# Patient Record
Sex: Male | Born: 1935 | Race: Black or African American | Hispanic: No | Marital: Married | Smoking: Never smoker
Health system: Southern US, Community
[De-identification: ages and names within clinical notes are randomized; demographics above are authoritative.]

## PROBLEM LIST (undated history)

## (undated) DIAGNOSIS — M1990 Unspecified osteoarthritis, unspecified site: Secondary | ICD-10-CM

## (undated) DIAGNOSIS — I10 Essential (primary) hypertension: Secondary | ICD-10-CM

## (undated) HISTORY — PX: HERNIA REPAIR: SHX51

---

## 2005-03-18 ENCOUNTER — Encounter (INDEPENDENT_AMBULATORY_CARE_PROVIDER_SITE_OTHER): Payer: Self-pay | Admitting: Family Medicine

## 2005-06-13 ENCOUNTER — Ambulatory Visit: Payer: Self-pay | Admitting: Family Medicine

## 2005-06-17 ENCOUNTER — Encounter (INDEPENDENT_AMBULATORY_CARE_PROVIDER_SITE_OTHER): Payer: Self-pay | Admitting: Family Medicine

## 2005-06-17 LAB — CONVERTED CEMR LAB
PSA: 3.53 ng/mL
TSH: 1.35 microintl units/mL

## 2005-07-18 ENCOUNTER — Observation Stay (HOSPITAL_COMMUNITY): Admission: EM | Admit: 2005-07-18 | Discharge: 2005-07-20 | Payer: Self-pay | Admitting: Emergency Medicine

## 2005-08-01 ENCOUNTER — Ambulatory Visit: Payer: Self-pay | Admitting: Family Medicine

## 2005-08-08 ENCOUNTER — Ambulatory Visit: Payer: Self-pay | Admitting: Family Medicine

## 2005-09-08 ENCOUNTER — Ambulatory Visit: Payer: Self-pay | Admitting: Family Medicine

## 2005-10-10 ENCOUNTER — Ambulatory Visit: Payer: Self-pay | Admitting: Family Medicine

## 2005-10-31 ENCOUNTER — Ambulatory Visit: Payer: Self-pay | Admitting: Family Medicine

## 2005-11-01 ENCOUNTER — Encounter (INDEPENDENT_AMBULATORY_CARE_PROVIDER_SITE_OTHER): Payer: Self-pay | Admitting: Family Medicine

## 2005-11-01 LAB — CONVERTED CEMR LAB: Blood Glucose, Fasting: 100 mg/dL

## 2005-11-10 ENCOUNTER — Ambulatory Visit (HOSPITAL_COMMUNITY): Admission: RE | Admit: 2005-11-10 | Discharge: 2005-11-10 | Payer: Self-pay | Admitting: General Surgery

## 2005-11-10 ENCOUNTER — Encounter (INDEPENDENT_AMBULATORY_CARE_PROVIDER_SITE_OTHER): Payer: Self-pay | Admitting: *Deleted

## 2005-11-22 ENCOUNTER — Ambulatory Visit: Payer: Self-pay | Admitting: Family Medicine

## 2006-01-05 ENCOUNTER — Ambulatory Visit: Payer: Self-pay | Admitting: Family Medicine

## 2006-01-19 ENCOUNTER — Ambulatory Visit: Payer: Self-pay | Admitting: Family Medicine

## 2006-02-23 ENCOUNTER — Ambulatory Visit: Payer: Self-pay | Admitting: Family Medicine

## 2006-03-28 ENCOUNTER — Ambulatory Visit: Payer: Self-pay | Admitting: Family Medicine

## 2006-04-25 ENCOUNTER — Ambulatory Visit: Payer: Self-pay | Admitting: Family Medicine

## 2006-05-23 ENCOUNTER — Ambulatory Visit: Payer: Self-pay | Admitting: Family Medicine

## 2006-06-19 ENCOUNTER — Ambulatory Visit: Payer: Self-pay | Admitting: Family Medicine

## 2006-06-20 ENCOUNTER — Encounter (INDEPENDENT_AMBULATORY_CARE_PROVIDER_SITE_OTHER): Payer: Self-pay | Admitting: Family Medicine

## 2006-06-20 LAB — CONVERTED CEMR LAB
PSA: 2.96 ng/mL
RBC count: 4.56 10*6/uL
WBC, blood: 6.6 10*3/uL

## 2006-07-24 ENCOUNTER — Encounter: Payer: Self-pay | Admitting: Family Medicine

## 2006-07-24 DIAGNOSIS — E785 Hyperlipidemia, unspecified: Secondary | ICD-10-CM

## 2006-07-24 DIAGNOSIS — I517 Cardiomegaly: Secondary | ICD-10-CM | POA: Insufficient documentation

## 2006-07-24 DIAGNOSIS — I872 Venous insufficiency (chronic) (peripheral): Secondary | ICD-10-CM | POA: Insufficient documentation

## 2006-07-24 DIAGNOSIS — Z860100 Personal history of colon polyps, unspecified: Secondary | ICD-10-CM | POA: Insufficient documentation

## 2006-07-24 DIAGNOSIS — I7300 Raynaud's syndrome without gangrene: Secondary | ICD-10-CM

## 2006-07-24 DIAGNOSIS — I10 Essential (primary) hypertension: Secondary | ICD-10-CM | POA: Insufficient documentation

## 2006-11-14 ENCOUNTER — Ambulatory Visit: Payer: Self-pay | Admitting: Family Medicine

## 2006-11-14 DIAGNOSIS — N401 Enlarged prostate with lower urinary tract symptoms: Secondary | ICD-10-CM

## 2006-11-14 LAB — CONVERTED CEMR LAB
Cholesterol, target level: 200 mg/dL
HDL goal, serum: 40 mg/dL
LDL Goal: 100 mg/dL

## 2006-12-27 ENCOUNTER — Ambulatory Visit: Payer: Self-pay | Admitting: Family Medicine

## 2006-12-27 DIAGNOSIS — M1990 Unspecified osteoarthritis, unspecified site: Secondary | ICD-10-CM | POA: Insufficient documentation

## 2006-12-28 ENCOUNTER — Encounter (INDEPENDENT_AMBULATORY_CARE_PROVIDER_SITE_OTHER): Payer: Self-pay | Admitting: Family Medicine

## 2006-12-29 ENCOUNTER — Encounter (INDEPENDENT_AMBULATORY_CARE_PROVIDER_SITE_OTHER): Payer: Self-pay | Admitting: Family Medicine

## 2006-12-29 LAB — CONVERTED CEMR LAB
ALT: 28 units/L (ref 0–53)
AST: 33 units/L (ref 0–37)
Albumin: 3.8 g/dL (ref 3.5–5.2)
Alkaline Phosphatase: 91 units/L (ref 39–117)
BUN: 18 mg/dL (ref 6–23)
CO2: 28 meq/L (ref 19–32)
Calcium: 9.2 mg/dL (ref 8.4–10.5)
Chloride: 106 meq/L (ref 96–112)
Cholesterol: 203 mg/dL — ABNORMAL HIGH (ref 0–200)
Creatinine, Ser: 1.15 mg/dL (ref 0.40–1.50)
Glucose, Bld: 96 mg/dL (ref 70–99)
HDL: 68 mg/dL (ref >39)
LDL Cholesterol: 120 mg/dL — ABNORMAL HIGH (ref 0–99)
Potassium: 4.7 meq/L (ref 3.5–5.3)
Sodium: 142 meq/L (ref 135–145)
Total Bilirubin: 0.6 mg/dL (ref 0.3–1.2)
Total CHOL/HDL Ratio: 3
Total Protein: 6.6 g/dL (ref 6.0–8.3)
Triglycerides: 74 mg/dL (ref <150)
VLDL: 15 mg/dL (ref 0–40)

## 2007-01-04 ENCOUNTER — Ambulatory Visit: Payer: Self-pay | Admitting: Family Medicine

## 2007-01-04 DIAGNOSIS — J301 Allergic rhinitis due to pollen: Secondary | ICD-10-CM

## 2007-03-21 ENCOUNTER — Telehealth (INDEPENDENT_AMBULATORY_CARE_PROVIDER_SITE_OTHER): Payer: Self-pay | Admitting: *Deleted

## 2007-03-22 ENCOUNTER — Telehealth (INDEPENDENT_AMBULATORY_CARE_PROVIDER_SITE_OTHER): Payer: Self-pay | Admitting: *Deleted

## 2007-03-22 ENCOUNTER — Ambulatory Visit: Payer: Self-pay | Admitting: Family Medicine

## 2007-03-22 DIAGNOSIS — K5900 Constipation, unspecified: Secondary | ICD-10-CM | POA: Insufficient documentation

## 2007-03-22 LAB — CONVERTED CEMR LAB: LDL Goal: 160 mg/dL

## 2007-03-23 ENCOUNTER — Encounter (INDEPENDENT_AMBULATORY_CARE_PROVIDER_SITE_OTHER): Payer: Self-pay | Admitting: Family Medicine

## 2007-03-23 ENCOUNTER — Telehealth (INDEPENDENT_AMBULATORY_CARE_PROVIDER_SITE_OTHER): Payer: Self-pay | Admitting: Family Medicine

## 2007-03-23 LAB — CONVERTED CEMR LAB: Pro B Natriuretic peptide (BNP): 80 pg/mL (ref 0.0–100.0)

## 2007-03-30 ENCOUNTER — Encounter (INDEPENDENT_AMBULATORY_CARE_PROVIDER_SITE_OTHER): Payer: Self-pay | Admitting: Family Medicine

## 2007-04-11 ENCOUNTER — Encounter (INDEPENDENT_AMBULATORY_CARE_PROVIDER_SITE_OTHER): Payer: Self-pay | Admitting: Family Medicine

## 2007-04-12 ENCOUNTER — Encounter (INDEPENDENT_AMBULATORY_CARE_PROVIDER_SITE_OTHER): Payer: Self-pay | Admitting: Family Medicine

## 2007-04-22 ENCOUNTER — Emergency Department (HOSPITAL_COMMUNITY): Admission: EM | Admit: 2007-04-22 | Discharge: 2007-04-22 | Payer: Self-pay | Admitting: Emergency Medicine

## 2007-04-23 ENCOUNTER — Ambulatory Visit: Payer: Self-pay | Admitting: Family Medicine

## 2007-04-23 ENCOUNTER — Telehealth (INDEPENDENT_AMBULATORY_CARE_PROVIDER_SITE_OTHER): Payer: Self-pay | Admitting: *Deleted

## 2007-04-24 ENCOUNTER — Encounter (INDEPENDENT_AMBULATORY_CARE_PROVIDER_SITE_OTHER): Payer: Self-pay | Admitting: Family Medicine

## 2007-04-25 ENCOUNTER — Telehealth (INDEPENDENT_AMBULATORY_CARE_PROVIDER_SITE_OTHER): Payer: Self-pay | Admitting: *Deleted

## 2007-04-25 LAB — CONVERTED CEMR LAB: TSH: 0.927 microintl units/mL (ref 0.350–5.50)

## 2007-05-02 ENCOUNTER — Ambulatory Visit (HOSPITAL_COMMUNITY): Admission: RE | Admit: 2007-05-02 | Discharge: 2007-05-02 | Payer: Self-pay | Admitting: Family Medicine

## 2007-05-02 ENCOUNTER — Encounter (INDEPENDENT_AMBULATORY_CARE_PROVIDER_SITE_OTHER): Payer: Self-pay | Admitting: Family Medicine

## 2007-05-04 ENCOUNTER — Ambulatory Visit: Payer: Self-pay | Admitting: Internal Medicine

## 2007-05-08 ENCOUNTER — Telehealth (INDEPENDENT_AMBULATORY_CARE_PROVIDER_SITE_OTHER): Payer: Self-pay | Admitting: *Deleted

## 2007-05-11 ENCOUNTER — Encounter (INDEPENDENT_AMBULATORY_CARE_PROVIDER_SITE_OTHER): Payer: Self-pay | Admitting: Urology

## 2007-05-11 ENCOUNTER — Inpatient Hospital Stay (HOSPITAL_COMMUNITY): Admission: RE | Admit: 2007-05-11 | Discharge: 2007-05-13 | Payer: Self-pay | Admitting: Urology

## 2007-06-27 ENCOUNTER — Encounter (INDEPENDENT_AMBULATORY_CARE_PROVIDER_SITE_OTHER): Payer: Self-pay | Admitting: Family Medicine

## 2007-08-31 ENCOUNTER — Ambulatory Visit: Payer: Self-pay | Admitting: Family Medicine

## 2007-08-31 DIAGNOSIS — F528 Other sexual dysfunction not due to a substance or known physiological condition: Secondary | ICD-10-CM

## 2007-09-01 ENCOUNTER — Encounter (INDEPENDENT_AMBULATORY_CARE_PROVIDER_SITE_OTHER): Payer: Self-pay | Admitting: Family Medicine

## 2007-09-04 ENCOUNTER — Telehealth (INDEPENDENT_AMBULATORY_CARE_PROVIDER_SITE_OTHER): Payer: Self-pay | Admitting: *Deleted

## 2007-09-04 LAB — CONVERTED CEMR LAB
ALT: 23 units/L (ref 0–53)
AST: 24 units/L (ref 0–37)
Albumin: 4 g/dL (ref 3.5–5.2)
Alkaline Phosphatase: 84 units/L (ref 39–117)
BUN: 20 mg/dL (ref 6–23)
Basophils Absolute: 0 10*3/uL (ref 0.0–0.1)
Basophils Relative: 1 % (ref 0–1)
CO2: 26 meq/L (ref 19–32)
Calcium: 9.2 mg/dL (ref 8.4–10.5)
Chloride: 106 meq/L (ref 96–112)
Cholesterol: 207 mg/dL — ABNORMAL HIGH (ref 0–200)
Creatinine, Ser: 1.15 mg/dL (ref 0.40–1.50)
Eosinophils Absolute: 0.3 10*3/uL (ref 0.0–0.7)
Eosinophils Relative: 6 % — ABNORMAL HIGH (ref 0–5)
Glucose, Bld: 95 mg/dL (ref 70–99)
HCT: 41.8 % (ref 39.0–52.0)
HDL: 66 mg/dL (ref >39)
Hemoglobin: 13.9 g/dL (ref 13.0–17.0)
LDL Cholesterol: 126 mg/dL — ABNORMAL HIGH (ref 0–99)
Lymphocytes Relative: 42 % (ref 12–46)
Lymphs Abs: 1.7 10*3/uL (ref 0.7–4.0)
MCHC: 33.3 g/dL (ref 30.0–36.0)
MCV: 90.1 fL (ref 78.0–100.0)
Monocytes Absolute: 0.3 10*3/uL (ref 0.1–1.0)
Monocytes Relative: 7 % (ref 3–12)
Neutro Abs: 1.9 10*3/uL (ref 1.7–7.7)
Neutrophils Relative %: 45 % (ref 43–77)
Platelets: 209 10*3/uL (ref 150–400)
Potassium: 4.4 meq/L (ref 3.5–5.3)
RBC: 4.64 M/uL (ref 4.22–5.81)
RDW: 12.9 % (ref 11.5–15.5)
Sodium: 141 meq/L (ref 135–145)
TSH: 1.43 microintl units/mL (ref 0.350–5.50)
Total Bilirubin: 0.6 mg/dL (ref 0.3–1.2)
Total CHOL/HDL Ratio: 3.1
Total Protein: 6.8 g/dL (ref 6.0–8.3)
Triglycerides: 74 mg/dL (ref <150)
VLDL: 15 mg/dL (ref 0–40)
WBC: 4.2 10*3/uL (ref 4.0–10.5)

## 2007-10-12 ENCOUNTER — Ambulatory Visit: Payer: Self-pay | Admitting: Family Medicine

## 2007-10-15 ENCOUNTER — Telehealth (INDEPENDENT_AMBULATORY_CARE_PROVIDER_SITE_OTHER): Payer: Self-pay | Admitting: *Deleted

## 2007-12-04 ENCOUNTER — Ambulatory Visit: Payer: Self-pay | Admitting: Family Medicine

## 2007-12-05 ENCOUNTER — Telehealth (INDEPENDENT_AMBULATORY_CARE_PROVIDER_SITE_OTHER): Payer: Self-pay | Admitting: *Deleted

## 2007-12-26 ENCOUNTER — Ambulatory Visit: Payer: Self-pay | Admitting: Family Medicine

## 2007-12-26 DIAGNOSIS — J309 Allergic rhinitis, unspecified: Secondary | ICD-10-CM | POA: Insufficient documentation

## 2008-01-07 ENCOUNTER — Ambulatory Visit: Payer: Self-pay | Admitting: Family Medicine

## 2008-02-18 ENCOUNTER — Encounter (INDEPENDENT_AMBULATORY_CARE_PROVIDER_SITE_OTHER): Payer: Self-pay | Admitting: Family Medicine

## 2008-03-13 ENCOUNTER — Encounter (INDEPENDENT_AMBULATORY_CARE_PROVIDER_SITE_OTHER): Payer: Self-pay | Admitting: Family Medicine

## 2008-03-18 ENCOUNTER — Ambulatory Visit: Payer: Self-pay | Admitting: Family Medicine

## 2008-03-19 ENCOUNTER — Encounter (INDEPENDENT_AMBULATORY_CARE_PROVIDER_SITE_OTHER): Payer: Self-pay | Admitting: Family Medicine

## 2008-03-19 LAB — CONVERTED CEMR LAB
ALT: 31 units/L (ref 0–53)
AST: 28 units/L (ref 0–37)
Albumin: 4 g/dL (ref 3.5–5.2)
Alkaline Phosphatase: 85 units/L (ref 39–117)
BUN: 19 mg/dL (ref 6–23)
CO2: 21 meq/L (ref 19–32)
Calcium: 9 mg/dL (ref 8.4–10.5)
Chloride: 104 meq/L (ref 96–112)
Creatinine, Ser: 1.1 mg/dL (ref 0.40–1.50)
Glucose, Bld: 102 mg/dL — ABNORMAL HIGH (ref 70–99)
Potassium: 4.1 meq/L (ref 3.5–5.3)
Sodium: 138 meq/L (ref 135–145)
Total Bilirubin: 0.5 mg/dL (ref 0.3–1.2)
Total Protein: 7 g/dL (ref 6.0–8.3)

## 2008-03-27 ENCOUNTER — Ambulatory Visit: Payer: Self-pay | Admitting: Family Medicine

## 2008-03-27 DIAGNOSIS — IMO0002 Reserved for concepts with insufficient information to code with codable children: Secondary | ICD-10-CM | POA: Insufficient documentation

## 2008-03-27 DIAGNOSIS — R413 Other amnesia: Secondary | ICD-10-CM

## 2008-05-08 ENCOUNTER — Inpatient Hospital Stay (HOSPITAL_COMMUNITY): Admission: EM | Admit: 2008-05-08 | Discharge: 2008-05-09 | Payer: Self-pay | Admitting: Emergency Medicine

## 2008-05-09 ENCOUNTER — Ambulatory Visit: Payer: Self-pay | Admitting: Internal Medicine

## 2008-05-19 ENCOUNTER — Encounter (INDEPENDENT_AMBULATORY_CARE_PROVIDER_SITE_OTHER): Payer: Self-pay | Admitting: Family Medicine

## 2008-05-19 ENCOUNTER — Ambulatory Visit (HOSPITAL_COMMUNITY): Admission: RE | Admit: 2008-05-19 | Discharge: 2008-05-19 | Payer: Self-pay | Admitting: Internal Medicine

## 2008-05-19 ENCOUNTER — Ambulatory Visit: Payer: Self-pay | Admitting: Internal Medicine

## 2008-06-18 ENCOUNTER — Ambulatory Visit: Payer: Self-pay | Admitting: Family Medicine

## 2008-06-18 DIAGNOSIS — A630 Anogenital (venereal) warts: Secondary | ICD-10-CM | POA: Insufficient documentation

## 2008-06-19 LAB — CONVERTED CEMR LAB
ALT: 26 units/L (ref 0–53)
AST: 25 units/L (ref 0–37)
Albumin: 4.2 g/dL (ref 3.5–5.2)
Alkaline Phosphatase: 95 units/L (ref 39–117)
BUN: 15 mg/dL (ref 6–23)
Basophils Absolute: 0 10*3/uL (ref 0.0–0.1)
Basophils Relative: 1 % (ref 0–1)
CO2: 25 meq/L (ref 19–32)
Calcium: 9.4 mg/dL (ref 8.4–10.5)
Chloride: 104 meq/L (ref 96–112)
Creatinine, Ser: 1.19 mg/dL (ref 0.40–1.50)
Eosinophils Absolute: 0.3 10*3/uL (ref 0.0–0.7)
Eosinophils Relative: 5 % (ref 0–5)
Glucose, Bld: 88 mg/dL (ref 70–99)
HCT: 45 % (ref 39.0–52.0)
Hemoglobin: 14.6 g/dL (ref 13.0–17.0)
Lymphocytes Relative: 32 % (ref 12–46)
Lymphs Abs: 1.7 10*3/uL (ref 0.7–4.0)
MCHC: 32.4 g/dL (ref 30.0–36.0)
MCV: 91.8 fL (ref 78.0–100.0)
Monocytes Absolute: 0.3 10*3/uL (ref 0.1–1.0)
Monocytes Relative: 6 % (ref 3–12)
Neutro Abs: 3.1 10*3/uL (ref 1.7–7.7)
Neutrophils Relative %: 57 % (ref 43–77)
Platelets: 202 10*3/uL (ref 150–400)
Potassium: 4 meq/L (ref 3.5–5.3)
RBC: 4.9 M/uL (ref 4.22–5.81)
RDW: 13.3 % (ref 11.5–15.5)
Sodium: 141 meq/L (ref 135–145)
TSH: 1.265 microintl units/mL (ref 0.350–4.50)
Total Bilirubin: 0.5 mg/dL (ref 0.3–1.2)
Total Protein: 7.1 g/dL (ref 6.0–8.3)
WBC: 5.5 10*3/uL (ref 4.0–10.5)

## 2008-06-20 ENCOUNTER — Ambulatory Visit (HOSPITAL_COMMUNITY): Admission: RE | Admit: 2008-06-20 | Discharge: 2008-06-20 | Payer: Self-pay | Admitting: Family Medicine

## 2008-07-18 ENCOUNTER — Ambulatory Visit: Payer: Self-pay | Admitting: Family Medicine

## 2008-08-15 ENCOUNTER — Ambulatory Visit: Payer: Self-pay | Admitting: Family Medicine

## 2008-10-22 ENCOUNTER — Ambulatory Visit: Payer: Self-pay | Admitting: Family Medicine

## 2008-10-22 LAB — CONVERTED CEMR LAB
Bilirubin Urine: NEGATIVE
Glucose, Urine, Semiquant: NEGATIVE
Ketones, urine, test strip: NEGATIVE
Nitrite: NEGATIVE
Protein, U semiquant: NEGATIVE
Specific Gravity, Urine: 1.015
Urobilinogen, UA: 0.2
WBC Urine, dipstick: NEGATIVE
pH: 6

## 2008-10-24 ENCOUNTER — Encounter (INDEPENDENT_AMBULATORY_CARE_PROVIDER_SITE_OTHER): Payer: Self-pay | Admitting: Family Medicine

## 2008-10-28 LAB — CONVERTED CEMR LAB
ALT: 31 U/L
AST: 29 U/L
Albumin: 4 g/dL
Alkaline Phosphatase: 86 U/L
BUN: 15 mg/dL
CO2: 23 meq/L
Calcium: 9.2 mg/dL
Chloride: 105 meq/L
Cholesterol: 197 mg/dL
Creatinine, Ser: 1.19 mg/dL
Glucose, Bld: 105 mg/dL — ABNORMAL HIGH
HDL: 70 mg/dL
LDL Cholesterol: 114 mg/dL — ABNORMAL HIGH
Potassium: 4.1 meq/L
Sodium: 142 meq/L
Total Bilirubin: 0.7 mg/dL
Total CHOL/HDL Ratio: 2.8
Total Protein: 6.6 g/dL
Triglycerides: 66 mg/dL
VLDL: 13 mg/dL

## 2008-11-19 ENCOUNTER — Ambulatory Visit (HOSPITAL_COMMUNITY): Admission: RE | Admit: 2008-11-19 | Discharge: 2008-11-19 | Payer: Self-pay | Admitting: Family Medicine

## 2008-11-19 ENCOUNTER — Ambulatory Visit: Payer: Self-pay | Admitting: Family Medicine

## 2009-02-02 ENCOUNTER — Ambulatory Visit: Payer: Self-pay | Admitting: Family Medicine

## 2009-03-11 ENCOUNTER — Ambulatory Visit: Payer: Self-pay | Admitting: Family Medicine

## 2009-04-16 ENCOUNTER — Telehealth (INDEPENDENT_AMBULATORY_CARE_PROVIDER_SITE_OTHER): Payer: Self-pay | Admitting: Family Medicine

## 2009-04-17 ENCOUNTER — Encounter (INDEPENDENT_AMBULATORY_CARE_PROVIDER_SITE_OTHER): Payer: Self-pay | Admitting: Family Medicine

## 2009-04-20 ENCOUNTER — Encounter (INDEPENDENT_AMBULATORY_CARE_PROVIDER_SITE_OTHER): Payer: Self-pay | Admitting: Family Medicine

## 2009-04-23 ENCOUNTER — Ambulatory Visit: Payer: Self-pay | Admitting: Family Medicine

## 2009-04-23 ENCOUNTER — Ambulatory Visit (HOSPITAL_COMMUNITY): Admission: RE | Admit: 2009-04-23 | Discharge: 2009-04-23 | Payer: Self-pay | Admitting: Family Medicine

## 2009-04-24 ENCOUNTER — Encounter (INDEPENDENT_AMBULATORY_CARE_PROVIDER_SITE_OTHER): Payer: Self-pay | Admitting: Family Medicine

## 2009-04-24 LAB — CONVERTED CEMR LAB
ALT: 30 units/L (ref 0–53)
AST: 31 units/L (ref 0–37)
Albumin: 3.6 g/dL (ref 3.5–5.2)
Alkaline Phosphatase: 74 units/L (ref 39–117)
BUN: 18 mg/dL (ref 6–23)
Basophils Absolute: 0 10*3/uL (ref 0.0–0.1)
Basophils Relative: 1 % (ref 0–1)
CO2: 23 meq/L (ref 19–32)
Calcium: 8.9 mg/dL (ref 8.4–10.5)
Chloride: 103 meq/L (ref 96–112)
Creatinine, Ser: 1.14 mg/dL (ref 0.40–1.50)
Eosinophils Absolute: 0.2 10*3/uL (ref 0.0–0.7)
Eosinophils Relative: 5 % (ref 0–5)
Glucose, Bld: 70 mg/dL (ref 70–99)
HCT: 36.6 % — ABNORMAL LOW (ref 39.0–52.0)
Hemoglobin: 12.4 g/dL — ABNORMAL LOW (ref 13.0–17.0)
Lymphocytes Relative: 29 % (ref 12–46)
Lymphs Abs: 1.2 10*3/uL (ref 0.7–4.0)
MCHC: 33.9 g/dL (ref 30.0–36.0)
MCV: 91.5 fL (ref 78.0–100.0)
Monocytes Absolute: 0.4 10*3/uL (ref 0.1–1.0)
Monocytes Relative: 9 % (ref 3–12)
Neutro Abs: 2.4 10*3/uL (ref 1.7–7.7)
Neutrophils Relative %: 57 % (ref 43–77)
Platelets: 175 10*3/uL (ref 150–400)
Potassium: 3.9 meq/L (ref 3.5–5.3)
RBC: 4 M/uL — ABNORMAL LOW (ref 4.22–5.81)
RDW: 13.4 % (ref 11.5–15.5)
Sodium: 138 meq/L (ref 135–145)
Total Bilirubin: 0.5 mg/dL (ref 0.3–1.2)
Total Protein: 6.1 g/dL (ref 6.0–8.3)
WBC: 4.3 10*3/uL (ref 4.0–10.5)

## 2009-04-27 LAB — CONVERTED CEMR LAB
Ferritin: 203 ng/mL (ref 22–322)
Folate: >20 ng/mL
Vitamin B-12: 832 pg/mL (ref 211–911)

## 2009-04-29 ENCOUNTER — Encounter (INDEPENDENT_AMBULATORY_CARE_PROVIDER_SITE_OTHER): Payer: Self-pay | Admitting: Family Medicine

## 2009-04-30 ENCOUNTER — Ambulatory Visit (HOSPITAL_COMMUNITY): Admission: RE | Admit: 2009-04-30 | Discharge: 2009-04-30 | Payer: Self-pay | Admitting: Family Medicine

## 2009-05-01 ENCOUNTER — Encounter (INDEPENDENT_AMBULATORY_CARE_PROVIDER_SITE_OTHER): Payer: Self-pay | Admitting: Family Medicine

## 2009-05-14 ENCOUNTER — Encounter (INDEPENDENT_AMBULATORY_CARE_PROVIDER_SITE_OTHER): Payer: Self-pay | Admitting: Family Medicine

## 2010-09-02 ENCOUNTER — Ambulatory Visit (HOSPITAL_COMMUNITY)
Admission: RE | Admit: 2010-09-02 | Discharge: 2010-09-02 | Payer: Self-pay | Source: Home / Self Care | Attending: Family Medicine | Admitting: Family Medicine

## 2010-11-08 ENCOUNTER — Encounter (HOSPITAL_COMMUNITY): Payer: Self-pay | Admitting: Radiology

## 2010-11-08 ENCOUNTER — Emergency Department (HOSPITAL_COMMUNITY)
Admission: EM | Admit: 2010-11-08 | Discharge: 2010-11-09 | Disposition: A | Discharge status: Home / Self Care | Payer: Medicare HMO | Attending: Emergency Medicine | Admitting: Emergency Medicine

## 2010-11-08 ENCOUNTER — Emergency Department (HOSPITAL_COMMUNITY): Payer: Medicare HMO

## 2010-11-08 DIAGNOSIS — Z9889 Other specified postprocedural states: Secondary | ICD-10-CM | POA: Insufficient documentation

## 2010-11-08 DIAGNOSIS — R55 Syncope and collapse: Secondary | ICD-10-CM | POA: Insufficient documentation

## 2010-11-08 DIAGNOSIS — I10 Essential (primary) hypertension: Secondary | ICD-10-CM | POA: Insufficient documentation

## 2010-11-08 DIAGNOSIS — M1990 Unspecified osteoarthritis, unspecified site: Secondary | ICD-10-CM | POA: Insufficient documentation

## 2010-11-08 DIAGNOSIS — F0390 Unspecified dementia without behavioral disturbance: Secondary | ICD-10-CM | POA: Insufficient documentation

## 2010-11-08 HISTORY — DX: Essential (primary) hypertension: I10

## 2010-11-08 LAB — POCT I-STAT, CHEM 8
BUN: 22 mg/dL (ref 6–23)
Calcium, Ion: 1.16 mmol/L (ref 1.12–1.32)
Chloride: 106 mEq/L (ref 96–112)
Glucose, Bld: 109 mg/dL — ABNORMAL HIGH (ref 70–99)
Hemoglobin: 12.9 g/dL — ABNORMAL LOW (ref 13.0–17.0)
Potassium: 3.7 mEq/L (ref 3.5–5.1)
TCO2: 25 mmol/L (ref 0–100)

## 2010-11-09 ENCOUNTER — Other Ambulatory Visit (HOSPITAL_COMMUNITY): Payer: Self-pay | Admitting: Family Medicine

## 2010-11-09 DIAGNOSIS — R55 Syncope and collapse: Secondary | ICD-10-CM

## 2010-11-11 ENCOUNTER — Ambulatory Visit (HOSPITAL_COMMUNITY)
Admission: RE | Admit: 2010-11-11 | Discharge: 2010-11-11 | Disposition: A | Discharge status: Home / Self Care | Payer: Medicare HMO | Source: Ambulatory Visit | Attending: Family Medicine | Admitting: Family Medicine

## 2010-11-11 DIAGNOSIS — R55 Syncope and collapse: Secondary | ICD-10-CM | POA: Insufficient documentation

## 2010-11-11 DIAGNOSIS — Z87891 Personal history of nicotine dependence: Secondary | ICD-10-CM | POA: Insufficient documentation

## 2010-11-11 DIAGNOSIS — I10 Essential (primary) hypertension: Secondary | ICD-10-CM | POA: Insufficient documentation

## 2010-11-11 DIAGNOSIS — I517 Cardiomegaly: Secondary | ICD-10-CM

## 2010-12-15 ENCOUNTER — Ambulatory Visit: Payer: Medicare HMO | Attending: Neurology

## 2010-12-15 DIAGNOSIS — G4733 Obstructive sleep apnea (adult) (pediatric): Secondary | ICD-10-CM | POA: Insufficient documentation

## 2010-12-15 DIAGNOSIS — R0989 Other specified symptoms and signs involving the circulatory and respiratory systems: Secondary | ICD-10-CM | POA: Insufficient documentation

## 2010-12-15 DIAGNOSIS — R0609 Other forms of dyspnea: Secondary | ICD-10-CM | POA: Insufficient documentation

## 2010-12-19 NOTE — Procedures (Signed)
NAMEBRICEN, VICTORY                ACCOUNT NO.:  [id number]  MEDICAL RECORD NO.:  [id number]          PATIENT TYPE:  OUT  LOCATION:  SLEEP LAB                     FACILITY:  [facility]  PHYSICIAN:  Jisell Majer A. Gerilyn Pilgrim, M.D. DATE OF BIRTH:  07/21/1924  DATE OF STUDY:  12/15/2010                           NOCTURNAL POLYSOMNOGRAM  REFERRING PHYSICIAN:  REFERRING PHYSICIAN:  Constantinos Krempasky  INDICATIONS:  An 77 year old man who presents with difficulty sleeping, snoring, and hypersomnia.  INDICATION FOR STUDY:  EPWORTH SLEEPINESS SCORE:  MEDICATIONS:  Losartan, aspirin, donepezil.  EPWORTH SLEEPINESS SCALE: 1. BMI 28.  ARCHITECTURAL SUMMARY:  Total recording time is 446 minutes.  Sleep efficiency was very low at 18%.  Sleep latency 55 minutes.  REM latency 195 minutes.  Stage N1 16%, N2 58%, N3 0%, and REM sleep 25.6%.  RESPIRATORY SUMMARY:  Baseline oxygen saturation is 98, lowest saturation 85.  Diagnostic AHI of 8.8 and RDI 9.5.  LIMB MOVEMENT SUMMARY:  PLM index 0.  ELECTROCARDIOGRAM SUMMARY:  Average heart rate is 62 with no significant dysrhythmias observed.  IMPRESSION: 1. Mild obstructive sleep apnea syndrome, not requiring positive     pressure treatment. 2. Abnormal architecture with very poor sleep efficiency.  SLEEP ARCHITECTURE:  RESPIRATORY DATA:  OXYGEN DATA:  CARDIAC DATA:  MOVEMENT-PARASOMNIA:  IMPRESSIONS-RECOMMENDATIONS:     Lihanna Biever A. Gerilyn Pilgrim, M.D. Electronically Signed 12/19/2010 21:23:11    KAD/MEDQ  D:  12/18/2010 21:23:41  T:  12/19/2010 01:23:48  Job:  [id number]

## 2011-01-11 NOTE — Op Note (Signed)
NAMEARGELIO, [name]                ACCOUNT NO.:  [id number]   MEDICAL RECORD NO.:  [id number]          PATIENT TYPE:  AMB   LOCATION:  DAY                           FACILITY:  [facility]   PHYSICIAN:  [name], M.D. DATE OF BIRTH:  1923/12/10   DATE OF PROCEDURE:  05/19/2008  DATE OF DISCHARGE:                               OPERATIVE REPORT   PROCEDURE:  Diagnostic colonoscopy.   INDICATIONS FOR PROCEDURE:  An 77 year old gentleman with chronic  constipation, history of anorectal condyloma acuminatum, recently  treated with cryotherapy by Dr. Suan Halter.  He developed small-volume  hematochezia after taking a hydrogen peroxide enema.  CT suggested  nonspecific proctitis and history of a colonic adenomas previously.  Colonoscopy is now being done.  The risks, benefits, alternatives, and  limitations have been reviewed.  Questions were answered.  He is  agreeable.  Please see documentation in the medical record.   PROCEDURE NOTE:  O2 saturation, blood pressure, pulse, and respirations  were monitored throughout the entirety of the procedure.   CONSCIOUS SEDATION:  Versed 2 mg IV and Demerol 50 mg IV, single dose.   INSTRUMENT:  Pentax video chip system.   FINDINGS:  Digital exam revealed no abnormalities.  Perianal mucosa  appeared to be locally inflamed likely related to a recent cryotherapy,  did not see any residual condyloma.  Endoscopic findings:  The prep was  adequate, which is somewhat surprising when [name] told me he ate a  regular dinner with roast beef last night because he was hungry.  He did  take the prep subsequently.  Colon:  Colonic mucosa was surveyed from  the rectosigmoid junction through the left transverse, right colon, to  the appendiceal orifice, ileocecal valve, and cecum.  These structures  well seen and photographed for the record.  From this level, the scope  was slowly withdrawn.  All previously mentioned mucosal surfaces were  again seen.  The  patient had just a few scattered pancolonic  diverticula.  The remainder of the colonic mucosa appeared normal.  Scope was pulled down the rectum where thorough examination of the  rectal mucosa including retroflex view of the anal verge demonstrated  some submucosal petechiae, friability, circumferential distal 5 cm of  the rectum.  There were no ulcers, no evidence of polyp or neoplasia.  The patient tolerated the procedure well and was reactive in Endoscopy.   IMPRESSION:  Somewhat inflamed-appearing distal rectum as described  above, more than what is typically seen with enema trauma.  This likely  may be the residual from hydrogen peroxide enema and possibly a minor  contribution from recent anorectal cryotherapy.   RECOMMENDATIONS:  1. I would like to get his bowels moving better.  Constipation led to      the peroxide enema which I have strongly admonished [name] not to      use in the future.  He should take MiraLax 17 g orally at bedtime      for      constipation.  2. Canasa 1 g mesalamine suppository one per rectum at  bedtime x2      weeks, #14 prescribed.   I anticipate his rectal bleeding will be a self-limited phenomenon.      [name], M.D.  Electronically Signed     RMR/MEDQ  D:  05/19/2008  T:  05/20/2008  Job:  [id number]   cc:   [name], M.D.   Ree Kida MD Margo Aye

## 2011-01-11 NOTE — H&P (Signed)
[name], Jackson                ACCOUNT NO.:  [id number]   MEDICAL RECORD NO.:  [id number]          PATIENT TYPE:  AMB   LOCATION:  DAY                           FACILITY:  [facility]   PHYSICIAN:  Ky Barban, M.D.DATE OF BIRTH:  1924/02/26   DATE OF ADMISSION:  DATE OF DISCHARGE:  LH                              HISTORY & PHYSICAL   CHIEF COMPLAINT:  Symptoms of prostatism.   An 77 year old gentleman with further symptoms of prostatism.  His flow  rate is 60 mL per second, average flow rate is 4 mL.  Volume was 223 mL  that was consistent with obstruction.  PSA __________ is normal.  Cystoscopy shows enlarged prostate with bladder neck obstruction.  His  voiding is symptomatic.  He has been taking Flomax and Avodart for long  time and started to have these symptoms, so I advised him to undergo TUR  of prostate.  He came with his son.  Procedure, limitations, and  complications were explained.  They understand and want me to go ahead  and proceed with that.  He is coming as an outpatient. The patient will  have the procedure and then will be admitted in the hospital.   PAST MEDICAL HISTORY:  1. History of penile and anal warts.  2. Erectile dysfunction.  3. Degenerative arthritis.  4. Venous insufficiency.  5. History of syncope.  6. Insomnia.  7. Hypertension.  8. Hyperlipidemia.  9. Colon polyps, benign.   MEDICATIONS:  1. Diovan.  2. Aspirin.  3. Lipitor.  4. Amlodipine.  5. Flomax.  6. Avodart.   PERSONAL HISTORY:  Does not smoke or drink.   REVIEW OF SYSTEMS:  Unremarkable.   PHYSICAL EXAMINATION:  GENERAL:  Well-nourished, well-developed male in  no acute distress.  VITAL SIGNS:  Blood pressure is 130/80.  Temperature is normal.  CENTRAL NERVOUS SYSTEM:  No gross neurological deficit.  HEENT:  Negative.  CHEST:  Symmetrical.  HEART:  Regular sinus rhythm.  No murmur.  ABDOMEN:  Soft and flat.  Liver, spleen, and kidneys are not palpable.  No CVA  tenderness.  EXTERNAL GENITALIA:  Circumcised male with adequate meatus, testicular  normal.  RECTAL:  Normal sphincter tone.  Normal rectal mass.  Prostate 2+,  smooth and firm.   IMPRESSION:  1. Benign prostatic hypertrophy with bladder neck obstruction.  2. Hypertension.  3. Erectile dysfunction.   PLAN:  Transurethral resection of prostate and then admit him into the  hospital.      Ky Barban, M.D.  Electronically Signed     MIJ/MEDQ  D:  05/10/2007  T:  05/10/2007  Job:  [id number]   cc:   [name], M.D.

## 2011-01-11 NOTE — Op Note (Signed)
[name], [name]                ACCOUNT NO.:  [id number]   MEDICAL RECORD NO.:  [id number]          PATIENT TYPE:  INP   LOCATION:  A328                          FACILITY:  [facility]   PHYSICIAN:  Dalia Heading, M.D.  DATE OF BIRTH:  06-12-1924   DATE OF PROCEDURE:  DATE OF DISCHARGE:                               OPERATIVE REPORT   PREOPERATIVE DIAGNOSIS:  Left inguinal hernia.   POSTOPERATIVE DIAGNOSIS:  Left inguinal hernia, direct.   PROCEDURE:  Left inguinal herniorrhaphy.   SURGEON:  Dalia Heading, MD   ANESTHESIA:  Spinal.   INDICATIONS:  The patient is an 77 year old black male who presents with  a symptomatic left inguinal hernia.  The risks and benefits of the  procedure including bleeding, infection, pain, and recurrence of hernia,  were fully explained to the patient, who gave informed consent.   PROCEDURE NOTE:  The patient was already in the supine position after  undergoing a TURP by Dr. Jerre Simon of urology.  The left groin was prepped  and draped using the usual sterile technique with Betadine.  Surgical  site confirmation was performed.   A transverse incision was made down to the external oblique aponeurosis.  The aponeurosis was incised to the external ring.  A Penrose drain was  placed around the spermatic cord.  The vas deferens was noted within the  spermatic cord.  The ilioinguinal nerve was identified and retracted  inferiorly from the operative field.  The patient was noted to have a  small lipoma of the cord.  This was excised without difficulty.  A  direct hernia was found.  This was along the medial aspect of the floor  of the inguinal canal.  The hernia was incised along its base and  inverted.  A medium-sized polypropylene mesh plug was then placed into  this region and secured circumferentially to the transversalis fascia  using 2-0 Novofil interrupted sutures.  An onlay polypropylene mesh  patch was then placed along the floor of the inguinal  canal and secured  superiorly to the conjoined tendon and inferiorly to the shelving edge  of Poupart's ligament using 2-0 Novofil interrupted sutures.  The  internal ring was recreated using a 2-0 Novofil interrupted suture.  The  external oblique aponeurosis was reapproximated using a 2-0 Vicryl  running suture.  The subcutaneous layer was reapproximated using a 3-0  Vicryl interrupted suture.  The skin was closed using a 4-0 Vicryl  subcuticular suture.  Sensorcaine 0.5% was instilled in the surrounding  wound.  Dermabond was then applied.   All tape and needle counts were correct at the end of the procedure.  The patient was transferred to PACU in stable condition.   COMPLICATIONS:  None.   SPECIMEN:  None.   BLOOD LOSS:  Minimal.      Dalia Heading, M.D.  Electronically Signed     MAJ/MEDQ  D:  05/11/2007  T:  05/12/2007  Job:  [id number]   cc:   [name], M.D.   Ky Barban, M.D.  Fax: [phone]

## 2011-01-11 NOTE — Consult Note (Signed)
NAMESHAMARION, [name]                ACCOUNT NO.:  [id number]   MEDICAL RECORD NO.:  [id number]          PATIENT TYPE:  INP   LOCATION:  A306                          FACILITY:  [facility]   PHYSICIAN:  [name], M.D. DATE OF BIRTH:  09/24/1923   DATE OF CONSULTATION:  05/09/2008  DATE OF DISCHARGE:                                 CONSULTATION   REASON FOR CONSULTATION:  GI bleed.   REQUESTING PHYSICIAN:  [name], M.D., with [facility].   HISTORY OF PRESENT ILLNESS:  Patient is an 77 year old African American  gentleman who presented to the emergency department with complaint of 2-  day history of rectal bleeding.  He states that he has a history of  perianal warts.  He had one frozen about 4 weeks ago by Dr. [name].  Over the last week, he has just felt more uncomfortable with some  constipation.  He does have chronic constipation, generally having a  bowel movement about 3 times a week.  He had went 2-3 days without a  bowel movement.  He was straining quite a bit and had the feeling that  he had to have a BM.  He had some itching as well and decided to take an  enema with peroxide at home.  Then he started having rectal pain and had  some small volume hematochezia after this.  He denies any abdominal  pain, no nausea or vomiting.  Appetite has been good.   HOME MEDICATIONS:  Blood pressure pill, cholesterol pill, Aleve 1 daily  for chronic back pain.   ALLERGIES:  No known drug allergies.   PAST MEDICAL HISTORY:  1. Hypertension.  2. BPH.  3. He had a TURP and left inguinal hernia repair in September 2008.  4. Hyperlipidemia.  5. History of anal/rectal warts.  6. Colonoscopy in March 2007, by Dr. [name] revealed a sessile      polyp in the rectum which was adenoma, a rectal mass which was      biopsied and turned out to be a condyloma acuminatum, a few      scattered diverticulum as well.   FAMILY HISTORY:  Negative for colorectal cancer or  chronic GI illnesses.   SOCIAL HISTORY:  He is married.  He has 2 daughters and a son who are  present today.  He quit smoking over 20 years ago.  No alcohol use.   REVIEW OF SYSTEMS:  See HPI for GI.  CONSTITUTIONAL:  No weight loss.  CARDIOPULMONARY:  No chest pain or shortness of breath.  GENITOURINARY:  No dysuria or hematuria.   PHYSICAL EXAMINATION:  VITAL SIGNS:  Temperature 98.6, pulse 86,  respirations 20, blood pressure 134/72, height 70 inches, weight 92.3  kg.  GENERAL APPEARANCE:  A pleasant, elderly black gentleman in no acute  distress.  SKIN:  Warm and dry, no jaundice.  HEENT:  Sclerae are nonicteric.  Oropharyngeal mucosa moist and pink.  No lesions, erythema or exudate.  NECK:  No lymphadenopathy or thyromegaly.  CHEST:  Lungs are clear to auscultation.  CARDIAC:  Regular  rate and rhythm, normal S1 and S2, no murmurs, rubs,  or gallops.  ABDOMEN:  Positive bowel sounds.  Abdomen soft, nontender, nondistended,  no organomegaly or masses.  No rebound, no guarding, no abdominal bruits  or hernias.  RECTAL:  One area of scarring at about 3 o'clock.  No masses externally.  There is some blood perianally and a small amount of gross blood on the  examination glove after internal exam.  Digital rectal exam was  nontender.   LABORATORY DATA:  Sodium 140, potassium 4.1, BUN 9, creatinine 1.18.  White count 8000, hemoglobin 13.9 initially, 13.6 today; platelets  182,000.  LFTs normal.  Lipase 25.  INR 1.   A CT of the abdomen and pelvis revealed stranding in the pararectal fat,  questionable nonspecific proctitis.   IMPRESSION:  Patient is an 77 year old African American gentleman with  chronic constipation, history of anorectal condyloma acuminatum, who  developed small volume hematochezia after taking a peroxide enema.  He  has had a positive tennismus.  CT shows nonspecific proctitis which may  be due to a peroxide enema.  There is no significant stool load on CT.   Given his history of prior adenomatous rectal polyp and abnormal CT  would offer an outpatient colonoscopy in the very near future.   PLAN:  1. Outpatient colonoscopy.  2. Given no further bleeding noted and stable hemoglobin, from a GI      standpoint, patient is stable for discharge.      [name], P.AJonathon Jackson, M.D.  Electronically Signed    LL/MEDQ  D:  05/09/2008  T:  05/09/2008  Job:  [id number]   cc:   [name], M.D.

## 2011-01-11 NOTE — Op Note (Signed)
NAMEJAMORION, [name]                ACCOUNT NO.:  [id number]   MEDICAL RECORD NO.:  [id number]          PATIENT TYPE:  INP   LOCATION:  A328                          FACILITY:  [facility]   PHYSICIAN:  Ky Barban, M.D.DATE OF BIRTH:  [birth year]   DATE OF PROCEDURE:  05/11/2007  DATE OF DISCHARGE:  05/02/2007                               OPERATIVE REPORT   PREOPERATIVE DIAGNOSIS:  Benign prostatic hypertrophy and left inguinal  hernia.   POSTOPERATIVE DIAGNOSIS:  Benign prostatic hypertrophy and left inguinal hernia.   PROCEDURE:  Transurethral resection of the prostate.   ANESTHESIA:  Spinal.   DESCRIPTION OF PROCEDURE:  With the patient under spinal anesthesia in  the lithotomy position, usual prep and drape, a #28 Iglesias  resectoscope was introduced and the bladder was inspected.  The  resectoscope was pulled back in the mid prostatic urethra bladder neck  was circumferentially dissected down to the circular fibers.  Bleeders  were coagulated.  The resectoscope was pulled back at the level of the  verumontanum, rotated to 11 o'clock position, resection at that level  was done from the bladder neck to the level of the verumontanum down to  the capsule. Then, the right lobe was resected between 11 and 7 o'clock  position.  Similarly, the left lobe was resected between the 1 and 5  o'clock position. There was a small amount of tissue in the anterior  midline which was resected next.  The posterior midline tissues were  resected at the end very carefully not to injure the sphincter of the  verumontanum.  The prostatic urethra was open, chips were evacuated,  bleeders were coagulated.  At this point, there was no obstruction of  the bladder neck.  The resectoscope was removed.  A 22 three way Foley  catheter left in for drainage, is running clear.  I left the operating  room and Dr. Lovell Sheehan took over the patient, at this point. The patient  is doing fine.  He is going to do the  left inguinal hernia repair.      Ky Barban, M.D.  Electronically Signed     MIJ/MEDQ  D:  05/11/2007  T:  05/12/2007  Job:  [id number]   cc:   Dalia Heading, M.D.  Fax: [phone]

## 2011-01-11 NOTE — H&P (Signed)
NAMESIAOSI, ALTER                ACCOUNT NO.:  [id number]   MEDICAL RECORD NO.:  [id number]          PATIENT TYPE:  AMB   LOCATION:  DAY                           FACILITY:  [facility]   PHYSICIAN:  Dalia Heading, M.D.  DATE OF BIRTH:  Dec 05, 1923   DATE OF ADMISSION:  05/02/2007  DATE OF DISCHARGE:  LH                              HISTORY & PHYSICAL   CHIEF COMPLAINT:  Left inguinal hernia.   HISTORY OF PRESENT ILLNESS:  The patient is an 77 year old black male  who was referred for evaluation and treatment of a left inguinal hernia.  This has been present for many months, but has started causing  discomfort and pain when he is standing or straining.  He would like to  get it fixed while undergoing a TURP by Dr. Jerre Simon of urology.   PAST MEDICAL HISTORY:  Hypertension and BPH.   PAST SURGICAL HISTORY:  Unremarkable.   CURRENT MEDICATIONS:  Blood pressure pill, cholesterol pill, aspirin  which he is holding.   ALLERGIES:  No known drug allergies.   REVIEW OF SYSTEMS:  The patient denies drinking or smoking.  He denies  any other cardiopulmonary difficulties or bleeding disorders.   PHYSICAL EXAMINATION:  GENERAL:  The patient is a well developed, well  nourished black male in no acute distress.  LUNGS:  Clear to auscultation with equal breath sounds bilaterally.  HEART:  Regular rate and rhythm without S3, S4, or murmurs.  ABDOMEN:  Soft, nontender, nondistended.  No hepatosplenomegaly or  masses noted.  A small left inguinal hernia is noted.  No right inguinal  hernia is present.   IMPRESSION:  Left inguinal hernia.   PLAN:  The patient is scheduled for left inguinal herniorrhaphy on  May 11, 2007.  The risks and benefits of the procedure including  bleeding, infection, pain, and recurrence of the hernia were fully  explained to the patient who gave informed consent.      Dalia Heading, M.D.  Electronically Signed     MAJ/MEDQ  D:  05/03/2007  T:  05/03/2007   Job:  [id number]   cc:   [name], M.D.   Ky Barban, M.D.  Fax: [phone]

## 2011-01-11 NOTE — H&P (Signed)
NAMEDETRICK, DANI                ACCOUNT NO.:  [id number]   MEDICAL RECORD NO.:  [id number]          PATIENT TYPE:  INP   LOCATION:  A306                          FACILITY:  [facility]   PHYSICIAN:  [name], M.D.DATE OF BIRTH:  08-13-1924   DATE OF ADMISSION:  05/08/2008  DATE OF DISCHARGE:  LH                              HISTORY & PHYSICAL   ADMISSION DIAGNOSES:  1. Lower gastrointestinal bleed.  2. Probable diverticular bleed versus hemorrhoid.  3. Constipation.   CHIEF COMPLAINT:  Constipation with some bleeding per rectum today.   HISTORY OF PRESENT ILLNESS:  Mr. Loftus is an 77 year old male who  presented to the emergency room with complaints of some abdominal pain,  constipation and less than an ounce of fresh blood in the bathroom.  The  patient usually has regular bowel movement but has not had any bowel  movement in about 3 days.  He noted that his prior bowel movement was  very small and hard in consistency.  He has felt some bloating and  cramping pain.  When the patient went to the bathroom he strained for a  little while and when he wiped himself he noted some bright red blood  and then he had a little amount of bright red blood in the toilet.  The  patient denies any fever or chills.  He has no prior history of  bleeding.  The patient had a colonoscopy about 3 years ago, was told it  was mostly polyp.   He otherwise feels well.  The patient was seen in the emergency room and  an abdominal CT scan was suggestive of possible diverticulosis.  The  patient was hemodynamically stable and hemoglobin and hematocrit were  unremarkable.  Possibility of a rectal mass was also entertained.   REVIEW OF SYSTEMS:  A 10 point review of systems otherwise negative.  The patient denies any weight loss.  No prior history of melenic stools.   PAST MEDICAL HISTORY:  1. Hypertension.  2. Dyslipidemia.  3. History of benign polyp status post colonoscopy about 3 years ago.  4.  History of syncope.  5. History of insomnia.  6. History of chronic renal insufficiency.  7. Degenerative joint disease.  8. Erectile dysfunction.  9. History of penile and anal warts.   MEDICATIONS:  The patient did not bring his home medications but based  on the records it appears that the patient is on the following  medicines:  1. Diovan.  2. Aspirin.  3. Lipitor.  4. Amlodipine.  5. Flomax.  6. Avodart.   ALLERGIES:  No known drug allergies.   FAMILY HISTORY:  Noncontributory.   SOCIAL HISTORY:  The patient lives at home.  He is a lifelong nonsmoker.  Does not drink alcohol.  No illicit drug use.  The patient is very  independent with activities of daily living.  He continues to drive.   PHYSICAL EXAM:  GENERAL:  The patient was conscious, alert, comfortable,  not in acute distress, was well oriented in time, place and person and  was very pleasant.  VITAL SIGNS:  Blood pressure is 141/103 with a pulse of 89, respirations  20, temperature 98.6 degrees Fahrenheit, oxygen saturation is 99% on  room air.  HEENT:  Normocephalic, atraumatic.  Oral mucosa was moist with no  exudates.  NECK:  Supple.  No JVD or lymphadenopathy.  LUNGS:  Were clear clinically with good air entry bilaterally.  HEART:  S1-S2 regular.  No S3, S4, gallops or rubs.  ABDOMEN:  Was soft, nontender.  Bowel sounds positive.  No masses  palpable.  EXTREMITIES:  No pitting pedal edema.  RECTAL:  Exam was performed by the emergency room doctor to show some  bright red blood on the examining glove.  CNS:  Again, grossly intact with no focal neurological deficits.   LABORATORY/DIAGNOSTIC DATA:  White blood cell count 8.2, hemoglobin of  13.9, hematocrit of 41, platelet count was 187 with 85% neutrophils.  PT  was 12.9, INR is 1.1.  Sodium 136, potassium 4.1, chloride of 106, CO2  was 27, glucose 122, BUN of 14, creatinine was 1.21, AST was normal at  28, ALT of 25, albumin 3.6, calcium 9.2.  Urinalysis  was negative.  Lipase was normal at 25.  CT scan shows no significant intraabdominal  masses but there was some perirectal stranding noted with a possible  rectal mass.   ASSESSMENT:  This is an 77 year old male who had a few days of  constipation who strained during a bowel movement and noticed bright red  blood.  The patient was hemodynamically stable and is not significantly  anemic.   PLAN:  1. Will admit the patient to the medical floor.  2. Will monitor for any more evidence of continued bleeding.  3. Will request gastroenterology consult in the morning.  Perhaps this      is a diverticular bleed or just a hemorrhoidal bleed.  However, a      CT scan was suggestive of possible mass in the rectum hence will      request Dr. Jena Gauss, the gastroenterologist, to evaluate.  4. We will place the patient on IV fluids normal saline at 75 mL an      hour.  5. The patient will be maintained on clear liquid diet for now.   CODE STATUS:  The patient is a full code.  I have explained the above  plan to the patient who verbalized full understanding.      [name], M.D.  Electronically Signed     AM/MEDQ  D:  05/09/2008  T:  05/09/2008  Job:  [id number]

## 2011-01-11 NOTE — Discharge Summary (Signed)
[name], [name]                ACCOUNT NO.:  [id number]   MEDICAL RECORD NO.:  [id number]          PATIENT TYPE:  INP   LOCATION:  A306                          FACILITY:  [facility]   PHYSICIAN:  Osvaldo Shipper, MD     DATE OF BIRTH:  [birth year]   DATE OF ADMISSION:  05/08/2008  DATE OF DISCHARGE:  09/11/2009LH                               DISCHARGE SUMMARY   Please review H&P dictated by Dr. Sherle Poe for details regarding the  patient's presenting illness.   DISCHARGE DIAGNOSES:  1. Hematochezia, resolved, likely diverticular versus hemorrhoidal.  2. History of hypertension and dyslipidemia, all stable.   BRIEF HOSPITAL COURSE:  This is an 77 year old African American male who  presented to the hospital with complaints of bleeding per rectum.  Apparently, the patient was constipated, and when he was straining, he  had ounce of fresh blood in the bathroom.  He states he has a bowel  movement about 3-4 times a week and never has any problems usually.  He  was admitted to the hospital for further evaluation and management.  The  patient's hemoglobin at that time of admission was 13.9, a repeat check  this morning was 13.6.  he has not had any more bleeding per rectum.  The rest of his labs are all okay.  He was seen by Gastroenterology, who  felt that he can be followed up as an outpatient, so once the patient  tolerates his supper, he can be discharged home.   Otherwise, he may continue all of his home medications.  Unfortunately,  the patient did not know the names of his medication, so we are not  quite sure what he takes and we do not know the doses of his medications  as well.   DISCHARGE MEDICATIONS:  He has been asked to take over-the-counter  Colace or Senokot everyday to avoid constipation.  Otherwise, he may  continue his home medications, and I was told that he may be on the  following, but this list is not complete, and we do not know the doses,  so he is on Diovan,  aspirin, Lipitor, amlodipine, Flomax, and Avodart.  No doses available.   FOLLOWUP:  Follow up with Dr. Jena Gauss.  He will call the patient's home to  set up an appointment.   DIET:  As instructed by Dr. Jena Gauss.   PHYSICAL ACTIVITY:  No restrictions.   IMAGING:  He did have a CAT scan with contrast done yesterday, which  showed evidence for possible proctitis and maybe some diverticula.  Chest x-ray was also done, which was unremarkable.   Total time at the discharging counter less than 30 minutes.      Osvaldo Shipper, MD  Electronically Signed     GK/MEDQ  D:  05/09/2008  T:  05/10/2008  Job:  [id number]   cc:   [name], M.D.  [address]  [place]  [place] [address]   [name], M.D.

## 2011-01-14 NOTE — Discharge Summary (Signed)
NAMEYOUNG, MULVEY                ACCOUNT NO.:  [id number]   MEDICAL RECORD NO.:  [id number]          PATIENT TYPE:  INP   LOCATION:  A328                          FACILITY:  [facility]   PHYSICIAN:  Ky Barban, M.D.DATE OF BIRTH:  1923/11/26   DATE OF ADMISSION:  05/11/2007  DATE OF DISCHARGE:  09/14/2008LH                               DISCHARGE SUMMARY   HOSPITAL COURSE:  This 77 year old gentleman had symptoms of prostatism  for long time.  Workup showed that he has BPH with bladder neck  obstruction, so we advised him to undergo TUR of prostate.  He has been  on Flomax and Avodart, but continued to have symptoms.  He was taken to  the operating room where TUR of prostate was done.  He has multiple  other problem which includes erectile dysfunction, degenerative  arthritis, history of syncope, insomnia, hypertension, benign polyps  removed from the colon:  The first postop day, he is doing fine.  Urine  is clear.  CVA pain has stopped.  He was out of bed and on a regular  diet.  Second postop day, urine is clear.  Foley catheter was  discontinued.  He is voiding fine.  Pathology report subsequently came  back as BPH.  He is being discharged home and will be followed up by me  in the office.   FINAL DISCHARGE DIAGNOSES.:  1. Benign prostatic hypertrophy.  2. Hypertension.   DISCHARGE MEDICATIONS:  He is advised to continue his usual medications.   FOLLOWUP:  I will see him back in the office in 2 weeks.      Ky Barban, M.D.  Electronically Signed     MIJ/MEDQ  D:  06/06/2007  T:  06/07/2007  Job:  [id number]

## 2011-01-14 NOTE — H&P (Signed)
NAMECHINONSO, LINKER                ACCOUNT NO.:  [id number]   MEDICAL RECORD NO.:  [id number]          PATIENT TYPE:  AMB   LOCATION:  DAY                           FACILITY:  [facility]   PHYSICIAN:  Jerolyn Shin C. Katrinka Blazing, M.D.   DATE OF BIRTH:  01-06-1924   DATE OF ADMISSION:  DATE OF DISCHARGE:  LH                                HISTORY & PHYSICAL   HISTORY OF PRESENT ILLNESS:  This is a 77 year old male who is scheduled for  routine colonoscopy.  He has not had any prior study.  No history of  abdominal pain.  No history of rectal bleeding.  No family history of colon  cancer or polyp.  He will have his routine screening colonoscopy.   PAST HISTORY:  1.  Hypertension.  2.  Possible Raynaud's phenomenon.   MEDICATION:  Diovan HCT 320/12.5.   SLEEP ARCHITECTURE:  No known drug allergies.   PHYSICAL EXAMINATION:  VITAL SIGNS:  Blood pressure 132/72, pulse 83,  respirations 20, weight 200 pounds.  HEENT:  Unremarkable.  NECK:  Supple.  No JVD, bruit, adenopathy or thyromegaly.  CHEST:  Clear to auscultation.  HEART:  Regular rate and rhythm without murmur, gallop, or rub.  ABDOMEN:  Soft, nontender.  No masses.  EXTREMITIES:  No clubbing, cyanosis, or edema.  NEUROLOGIC:  No focal motor, sensory or cerebellar deficit.   IMPRESSION:  1.  Need for screening colonoscopy.  2.  Hypertension.   PLAN:  Screening colonoscopy.      Dirk Dress. Katrinka Blazing, M.D.  Electronically Signed     LCS/MEDQ  D:  11/09/2005  T:  11/09/2005  Job:  [id number]

## 2011-01-14 NOTE — Discharge Summary (Signed)
[name], SEARFOSS                ACCOUNT NO.:  [id number]   MEDICAL RECORD NO.:  [id number]          PATIENT TYPE:  OBV   LOCATION:  A222                          FACILITY:  [facility]   PHYSICIAN:  Osvaldo Shipper, MD     DATE OF BIRTH:  1924-05-13   DATE OF ADMISSION:  07/18/2005  DATE OF DISCHARGE:  11/22/2006LH                                 DISCHARGE SUMMARY   PRIMARY CARE PHYSICIAN:  Greig Castilla B. Early Chars, M.D.   CARDIOLOGIST:  Dani Gobble, M.D.   DISCHARGE DIAGNOSES:  1.  Syncope, unclear etiology, possible vasovagal.  2.  Hypertension, stable.   Please review H&P dictated at the time of admission for details regarding  patient's presenting illness.   BRIEF HOSPITAL COURSE:  Briefly, this is a 77 year old [place] American  male with a recent diagnosis of hypertension who presented to the ED with  syncopal episode.  Patient did not  have any prodromal symptoms prior to the  onset of this syncope.  He was probably unconscious for a few minutes.  Patient  has had an extensive work-up in the hospital including CT scan of  the head which initially showed abnormalities in his left cerebellum for  which patient had a follow-up scan with MRI which did not show any acute  abnormality.  Also showed some white matter changes.   Patient underwent an echocardiogram  as well which showed mild concentric  left ventricular hypertrophy, normal left ventricular size and systolic  function without any regional wall motion abnormalities.  No aortic stenosis  was noted and mild MR and TR was noted.  Patient was also ruled out for  acute coronary syndrome by serial cardiac enzymes.  He was also monitored on  telemetry which did not show any arrhythmias.   Because patient had mentioned a possible past history of a silent MI for  which he was undergoing MI by Dr. Domingo Sep, we consulted Dr. Domingo Sep to  evaluate this patient in the hospital.  Her recommendations were followed.  Patient will likely need  an outpatient stress test which will be arranged by  her.  We will set up patient to follow up with [facility] in  one week.   In the hospital, patient has been asymptomatic, has been ambulating in the  hallways with no difficulties.  He has been chest pain-free.  Denied any  palpitations and denied any further lightheadedness or any other syncopal  episodes.  He is considered stable for discharge.   For further evaluation if patient continues to have these episodes of  lightheadedness, he may need to have either event monitor and/or tilt table  testing.   DISCHARGE MEDICATIONS:  1.  Aspirin 81 mg p.o. daily.  2.  Diovan/HCT 160/12.5 one tablet daily.  3.  Zocor 20 mg p.o. nightly.   FOLLOW UP:  1.  Dani Gobble, M.D., in one week.  2.  [name]. Early Chars, M.D., in two to three weeks.  3.  Carotid Dopplers in six months' time.   DIET:  Heart-healthy diet.   ACTIVITY:  No restrictions.   OTHER INSTRUCTIONS:  Patient has been asked to seek attention if he develops  chest pain, shortness of breath, significant lightheadedness  if he has more  syncopal episodes.   IMAGING AND OTHER STUDIES DURING THIS HOSPITAL STAY:  1.  CAT scan of the head.  2.  MRI and MRA of the head and neck. These have been discussed above.  3.  Chest x-ray did not show any acute abnormality.  4.  Carotid Doppler showed no hemodynamically significant stenosis but did      show bilateral carotid bifurcation and proximal ICA plaque formation and      continued surveillance was recommended.  5.  Echocardiogram also discussed above.   CONSULTATION:  Dani Gobble, M.D.      Osvaldo Shipper, MD  Electronically Signed     GK/MEDQ  D:  07/20/2005  T:  07/20/2005  Job:  [id number]   cc:   [name]. Early Chars, MD  Fax: [phone]   Dani Gobble, MD  Fax: [phone]

## 2011-01-14 NOTE — H&P (Signed)
[name], [name]                ACCOUNT NO.:  [id number]   MEDICAL RECORD NO.:  [id number]          PATIENT TYPE:  OBV   LOCATION:  A222                          FACILITY:  [facility]   PHYSICIAN:  Osvaldo Shipper, MD     DATE OF BIRTH:  12/18/1923   DATE OF ADMISSION:  07/18/2005  DATE OF DISCHARGE:  LH                                HISTORY & PHYSICAL   ADMITTING DIAGNOSES:  1.  Syncopal episode, etiology unclear.  2.  Chest pain, rule out acute coronary syndrome.  3.  Hypertension.   CHIEF COMPLAINT:  Passing out.   HISTORY OF PRESENT ILLNESS:  The patient is a 77 year old African-American  male who is pretty healthy and was only recently diagnosed with hypertension  and was started on Diovan HCT about 3 weeks ago by Dr. Early Chars.  According to  the patient, he was also told that he might have had a silent myocardial  ischemic event some time ago based on his EKG, and was recommended to have a  stress test, which was actually scheduled for tomorrow.   The patient was doing well until this morning when he went for his regular  walk.  The patient walks about 45 minutes to an hour every day.  Today,  after 35 minutes when the patient was walking up hill, he experienced some  left-sided chest discomfort.  It was about 2/10 in intensity.  The patient  got a little bit shortness of breath, decided to curtail his walk, and then  went back to his home, at which point, after resting for a while, the pain  went away.  He is currently chest pain free.  He does not give a history of  any palpitations.  There was no radiation of the pain to any other site.  No  history of any dizziness with that episode.  No history of headaches or  vision problems.   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  He does not remember how long he  was out, but he  thinks he might have been out for a few minutes.  He did  mention urinary incontinence with known stool incontinence.  He did not have  any symptoms treated prior to or after the incident.  No history suggestive  of any seizure episodes.  No history of any chest discomfort.  He did not  have any focal weakness before or after this episode.   The patient gives a history of a syncopal episode about 2 years ago while  sitting in a church.  He did not go to a doctor after that episode.  Otherwise, the patient is pretty healthy and goes for daily walks.   MEDICATIONS AT HOME:  1.  Diovan HCT 160/12.5 started about 3 weeks ago.  2.  Over-the-counter Excedrin as needed.  3.  Tylenol as needed.   ALLERGIES:  No known drug allergies.   PAST  MEDICAL HISTORY:  1.  Hypertension diagnosed recently.  2.  Questionable history of myocardial ischemic event in the past.  3.  No history of any lung disease.  No diabetes.  No cancer.   PAST SURGICAL HISTORY:  He has never had any surgery in the past.   SOCIAL HISTORY:  The patient lives in [place] with his wife.  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  He has a 10-pack year history of smoking and quit  about 20 years ago.  No history of any alcohol intake.  No history of any  illicit drug use.   FAMILY HISTORY:  His father had diabetes and died of coronary artery disease  at the age of 54.  Sister had some unknown cancer.  Otherwise, no other  positive history.   REVIEW OF SYSTEMS:  Ten point review of systems was done which was  unremarkable, except as mentioned in the HPI.   PHYSICAL EXAMINATION:  VITAL SIGNS:  Temperature 97.4, blood pressure  147/80, heart rate 87, respiratory rate 18, saturating 100% on room air.  Orthostatics were done in the ED, and it was negative for any orthostasis.  GENERAL:  A well-developed, well-nourished elderly gentleman in no apparent  distress, very pleasant to talk to.  HEENT:  There is no pattern of icterus.   Oral mucous membranes are moist.  No oral lesions are seen.  NECK:  Soft and supple.  No thyromegaly is appreciated.  CARDIOVASCULAR:  S1 and S2 are normal and regular.  No murmurs appreciated.  PMI is not displaced.  No JVD is seen.  LUNGS:  Clear to auscultation bilaterally.  No wheezing or rhonchi is heard.  ABDOMEN:  Soft, nontender, nondistended.  Bowel sounds are present.  No  organomegaly or masses are appreciated.  EXTREMITIES:  Without edema.  Peripheral pulses are palpable.  NEUROLOGIC:  The patient is alert and oriented.  Cranial nerves II-XII  appears to be normal.  Motor strength 5/5 in all extremities,  most muscle  groups.  Sensory exam is within normal limits.  Cerebellar exam within  normal limits.   LABORATORY DATA:  CBC is completely unremarkable.  PT INR is normal.  CMP  shows glucose of 155, AST 38; otherwise, others are within normal limits.  The urinalysis shows trace protein, a few squamous epithelial cells, 7-10  WBCs; otherwise, unremarkable.  CK total is 397.  MB is elevated slightly at  4.2.  Troponin 0.05.   EKG shows sinus rhythm, normal axis.  Intervals appear to be within normal  limits.  Nonsignificant Q waves in the inferior leads.  There are some  nonspecific T wave changes in I and aVL.  Otherwise, no other ST segment  changes appreciated at this time.  There is probably some early  repolarization.   Chest x-ray has been done, which did not show any acute pathology.   CT scan of the head was done initially which showed questionable vague  multiple low-density areas in the left cerebellum, questionable acute or  subacute ischemic infarction.  MRI was recommended by the radiologist, so  that was also done, which did not show any acute findings.  Chronic  microvascular white matter disease was noted.  MRA of the head and neck were  negative.  IMPRESSION:  This is a 77 year old African-American male who was recently  diagnosed with hypertension and  was undergoing a cardiac evaluation for  possible silent ischemia in the past, who had symptoms of lightheadedness  and possibly symptoms of angina who also had experienced a syncopal episode.  Currently, the patient is completely asymptomatic.  CK and CK-MB are mildly  elevated.  His troponin so far is not concerning.  I think it would be  prudent to admit this gentleman, at least to observation for now, and do  further work-up in the hospital.  Differential diagnoses for a syncope  episode include arrhythmias, acute ischemic event, seizure disorder.  Stroke  has been ruled out by a negative MRI.   PLAN:  1.  Syncope.  Will observe the patient in the hospital to rule him out for      acute coronary syndrome.  Monitor his arrhythmia by telemetry.  For now,      will start him just on aspirin.  I will consult Dr. Domingo Sep in the      morning because of his history of angina, which was followed by a      syncopal episode.  The patient is not really orthostatic in the ED.  2.  Hypertension.  I will hold his Diovan HCT at this time.  Will continue      to home his blood pressure closely and reinstitute medications as      needed.  3.  Echocardiogram will also be done.   Further management decisions will be based on the results of the initial  testing and the patient's response to treatment.      Osvaldo Shipper, MD  Electronically Signed     GK/MEDQ  D:  07/18/2005  T:  07/18/2005  Job:  [id number]   cc:   Tarri Abernethy, M.D.   Dani Gobble, MD  Fax: [phone]

## 2011-01-14 NOTE — H&P (Signed)
[name], [name]                ACCOUNT NO.:  [id number]   MEDICAL RECORD NO.:  [id number]          PATIENT TYPE:  AMB   LOCATION:  DAY                           FACILITY:  [facility]   PHYSICIAN:  Jerolyn Shin C. Katrinka Blazing, M.D.   DATE OF BIRTH:  Oct 16, 1923   DATE OF ADMISSION:  DATE OF DISCHARGE:  LH                                HISTORY & PHYSICAL   No dictation.      Dirk Dress. Katrinka Blazing, M.D.     LCS/MEDQ  D:  11/09/2005  T:  11/09/2005  Job:  [id number]

## 2011-01-14 NOTE — Procedures (Signed)
[name], [name]                ACCOUNT NO.:  [id number]   MEDICAL RECORD NO.:  [id number]          PATIENT TYPE:  OBV   LOCATION:  A222                          FACILITY:  [facility]   PHYSICIAN:  Dani Gobble, MD       DATE OF BIRTH:  08-Dec-1923   DATE OF PROCEDURE:  07/19/2005  DATE OF DISCHARGE:                                  ECHOCARDIOGRAM   INDICATION:  Syncope.   CLINICAL HISTORY:  The technical quality of the study is adequate.   M-MODE TRACINGS:  The aorta measures normally at 3.9 cm.   Left atrium is at the upper limits of normal in size although subjectively  it appears mildly dilated. No obvious clots or masses were appreciated and  the patient appeared to be in sinus rhythm during this procedure.   Intraventricular septum and posterior wall are mildly thickened measured at  1.4 cm and 1.3 cm, respectively.   The aortic valve is trileaflet and pliable with normal leaflet excursion. No  significant aortic insufficiency is noted. Doppler interrogation of the  aortic valve is within normal limits.   The mitral valve also appears structurally normal. It may be just mildly  thickened but without limitation to leaflet excursion. Trivial mitral  regurgitation is noted. No mitral valve prolapse is noted. Doppler  interrogation of the mitral valve is within normal limits.   The pulmonic valve is incompletely visualized but appeared to be grossly  structurally normal.   The tricuspid valve appeared grossly structurally normal with mild tricuspid  regurgitation noted. Estimated pulmonary pressures are at the upper limits  of normal.   The left ventricle is normal in size with the LVIDD measured at 3.4 cm and  the LVISD measured at 2.1 cm. Overall left ventricular systolic function is  normal and no regional wall motion abnormalities are noted. The presence of  diastolic dysfunction is inferred from pulsar Doppler across the mitral  valve.   The right atrium and right  ventricle are borderline dilated but right  ventricular systolic function is normal.   IMPRESSION:  1.  Left atrium borderline dilated.  2.  Mild concentric left ventricular hypertrophy.  3.  Trivial mitral and mild tricuspid regurgitation.  4.  Pulmonary pressures estimated to be at the upper limits of normal.  5.  Normal left ventricular size and systolic function without regional wall      motion abnormality noted.  6.  Borderline dilatation of the right-sided structures but with normal      right ventricular systolic function.  7.  The presence of diastolic dysfunction is inferred from pulsar Doppler      across the mitral valve.           ______________________________  Dani Gobble, MD     AB/MEDQ  D:  07/19/2005  T:  07/19/2005  Job:  [id number]   cc:   Osvaldo Shipper, MD

## 2011-06-01 LAB — DIFFERENTIAL
Eosinophils Relative: 1
Lymphocytes Relative: 17
Lymphocytes Relative: 8 — ABNORMAL LOW
Lymphs Abs: 0.7
Lymphs Abs: 1.3
Monocytes Relative: 6
Neutro Abs: 6.1
Neutrophils Relative %: 76

## 2011-06-01 LAB — URINALYSIS, ROUTINE W REFLEX MICROSCOPIC
Bilirubin Urine: NEGATIVE
Hgb urine dipstick: NEGATIVE
Protein, ur: NEGATIVE
Urobilinogen, UA: 0.2

## 2011-06-01 LAB — COMPREHENSIVE METABOLIC PANEL
AST: 28
Albumin: 3.6
Calcium: 9.2
Creatinine, Ser: 1.21
GFR calc Af Amer: >60
GFR calc non Af Amer: 57 — ABNORMAL LOW
Total Protein: 6.6

## 2011-06-01 LAB — PROTIME-INR
INR: 1
Prothrombin Time: 12.9

## 2011-06-01 LAB — CBC
MCHC: 33.8
MCV: 92
Platelets: 187
RBC: 4.36
RDW: 12.8
WBC: 8

## 2011-06-01 LAB — BASIC METABOLIC PANEL
Calcium: 9.2
Chloride: 105
Creatinine, Ser: 1.18
GFR calc Af Amer: >60
GFR calc non Af Amer: 59 — ABNORMAL LOW

## 2011-06-01 LAB — APTT: aPTT: 24

## 2011-06-01 LAB — LIPASE, BLOOD: Lipase: 25

## 2011-06-10 LAB — BASIC METABOLIC PANEL
BUN: 18
CO2: 27
CO2: 31
Calcium: 8.7
Calcium: 8.9
Chloride: 104
Creatinine, Ser: 1.48
GFR calc Af Amer: 58 — ABNORMAL LOW
GFR calc Af Amer: >60
GFR calc non Af Amer: 46 — ABNORMAL LOW
Glucose, Bld: 121 — ABNORMAL HIGH
Glucose, Bld: 123 — ABNORMAL HIGH
Glucose, Bld: 163 — ABNORMAL HIGH
Potassium: 4.2
Potassium: 4
Sodium: 135
Sodium: 139

## 2011-06-10 LAB — URINALYSIS, ROUTINE W REFLEX MICROSCOPIC
Bilirubin Urine: NEGATIVE
Bilirubin Urine: NEGATIVE
Glucose, UA: NEGATIVE
Glucose, UA: NEGATIVE
Hgb urine dipstick: NEGATIVE
Ketones, ur: NEGATIVE
Nitrite: NEGATIVE
Protein, ur: NEGATIVE
Protein, ur: NEGATIVE
Specific Gravity, Urine: 1.01
Urobilinogen, UA: 0.2
pH: 6

## 2011-06-10 LAB — DIFFERENTIAL
Basophils Absolute: 0
Basophils Absolute: 0
Basophils Relative: 0
Eosinophils Absolute: 0.1
Eosinophils Relative: 2
Eosinophils Relative: 2
Lymphocytes Relative: 17
Lymphs Abs: 0.9
Monocytes Absolute: 0.6
Monocytes Relative: 8
Neutro Abs: 5.3
Neutrophils Relative %: 75

## 2011-06-10 LAB — POCT CARDIAC MARKERS
CKMB, poc: 1.7
Myoglobin, poc: 111
Operator id: 217071
Troponin i, poc: <0.05
Troponin i, poc: <0.05

## 2011-06-10 LAB — CBC
HCT: 38 — ABNORMAL LOW
HCT: 39.3
HCT: 37.2 — ABNORMAL LOW
Hemoglobin: 12.7 — ABNORMAL LOW
Hemoglobin: 13.2
MCHC: 33.5
MCHC: 33.6
MCV: 92.2
Platelets: 161
RBC: 4.12 — ABNORMAL LOW
RBC: 4.28
RDW: 13.2
RDW: 13.2
RDW: 13.7
WBC: 5.4

## 2011-09-26 ENCOUNTER — Emergency Department (HOSPITAL_COMMUNITY)
Admission: EM | Admit: 2011-09-26 | Discharge: 2011-09-26 | Disposition: A | Discharge status: Home / Self Care | Payer: Medicare HMO | Attending: Emergency Medicine | Admitting: Emergency Medicine

## 2011-09-26 ENCOUNTER — Encounter (HOSPITAL_COMMUNITY): Payer: Self-pay

## 2011-09-26 DIAGNOSIS — I10 Essential (primary) hypertension: Secondary | ICD-10-CM | POA: Insufficient documentation

## 2011-09-26 DIAGNOSIS — H9209 Otalgia, unspecified ear: Secondary | ICD-10-CM | POA: Insufficient documentation

## 2011-09-26 DIAGNOSIS — H9190 Unspecified hearing loss, unspecified ear: Secondary | ICD-10-CM | POA: Insufficient documentation

## 2011-09-26 NOTE — ED Notes (Signed)
Ears flushed bilaterally with 1/2 strength peroxide several times. Pt states has some relief. Very small amount of ear wax noted upon removal.  Pt tolerated well.

## 2011-09-26 NOTE — ED Provider Notes (Signed)
Medical screening examination/treatment/procedure(s) were performed by non-physician practitioner and as supervising physician I was immediately available for consultation/collaboration.   Bhavya Grand, MD 09/26/11 1655 

## 2011-09-26 NOTE — ED Notes (Signed)
Pt reports ears have been stopped up for 2 years.  Denies pain.  Pt says came here to have ears irrigated.  Pt says went to Dr. Renard Matter several weeks ago and was told he had a lot of earwax buildup and tried something otc without relief.

## 2011-09-26 NOTE — ED Notes (Signed)
Pt presents secondary to bilateral discomfort in ears. Pt states has seen PCP secondary to hearing loss and "feeling like ears are stopped up". Pt reports using OTC ear wax removal kit purchased from drug store without much relief. Pt continues to be concerned with slight hearing loss.

## 2011-09-26 NOTE — ED Provider Notes (Signed)
History     CSN: [id number]  Arrival date & time 09/26/11  1054   First MD Initiated Contact with Patient 09/26/11 1144      Chief Complaint  Patient presents with  . Otalgia    (Consider location/radiation/quality/duration/timing/severity/associated sxs/prior treatment) HPI Comments: Pt saw his PCP, dr. Renard Matter a couple months ago and was told he had a cerumen impaction.  He purchased an ear cleaning product and has performed it several times.  He still doesn't think he is hearing any better although there is very little wax present.  Patient is a 78 y.o. male presenting with ear pain. The history is provided by the patient. No language interpreter was used.  Otalgia This is a new problem. There is pain in both ears. The problem occurs constantly. The problem has not changed since onset.There has been no fever. The pain is at a severity of 0/10. The patient is experiencing no pain. Associated symptoms include hearing loss. Pertinent negatives include no ear discharge, no rhinorrhea, no sore throat, no neck pain and no cough.    Past Medical History  Diagnosis Date  . Hypertension     Past Surgical History  Procedure Date  . Hernia repair     No family history on file.  History  Substance Use Topics  . Smoking status: Never Smoker   . Smokeless tobacco: Not on file  . Alcohol Use: No      Review of Systems  HENT: Positive for hearing loss and ear pain. Negative for sore throat, rhinorrhea, neck pain and ear discharge.   Respiratory: Negative for cough.   All other systems reviewed and are negative.    Allergies  Review of patient's allergies indicates no known allergies.  Home Medications  No current outpatient prescriptions on file.  BP 163/85  Pulse 79  Temp(Src) 98.3 F (36.8 C) (Oral)  Resp 20  Ht 5\' 10"  (1.778 m)  Wt 192 lb (87.091 kg)  BMI 27.55 kg/m2  SpO2 98%  Physical Exam  Nursing note and vitals reviewed. Constitutional: He is oriented to  person, place, and time. He appears well-developed and well-nourished.  HENT:  Head: Normocephalic and atraumatic.  Right Ear: Tympanic membrane and ear canal normal. There is drainage. Tympanic membrane is not injected, not perforated, not erythematous, not retracted and not bulging. No hemotympanum. Decreased hearing is noted.  Left Ear: Tympanic membrane and ear canal normal. There is drainage. Tympanic membrane is not injected, not perforated, not erythematous, not retracted and not bulging. Tympanic membrane mobility is normal. No hemotympanum. Decreased hearing is noted.  Eyes: EOM are normal.  Neck: Normal range of motion.  Cardiovascular: Normal rate, regular rhythm, normal heart sounds and intact distal pulses.   Pulmonary/Chest: Effort normal and breath sounds normal. No respiratory distress.  Abdominal: Soft. He exhibits no distension. There is no tenderness.  Musculoskeletal: Normal range of motion.  Neurological: He is alert and oriented to person, place, and time.  Skin: Skin is warm and dry.  Psychiatric: He has a normal mood and affect. Judgment normal.    ED Course  Procedures (including critical care time)  Labs Reviewed - No data to display No results found.   No diagnosis found.    MDM        Pt sa  Worthy Rancher, [place] 09/26/11 1343

## 2011-12-15 ENCOUNTER — Other Ambulatory Visit (HOSPITAL_COMMUNITY): Payer: Self-pay | Admitting: Family Medicine

## 2011-12-15 ENCOUNTER — Ambulatory Visit (HOSPITAL_COMMUNITY)
Admission: RE | Admit: 2011-12-15 | Discharge: 2011-12-15 | Disposition: A | Discharge status: Home / Self Care | Payer: Medicare HMO | Source: Ambulatory Visit | Attending: Family Medicine | Admitting: Family Medicine

## 2011-12-15 DIAGNOSIS — M545 Low back pain, unspecified: Secondary | ICD-10-CM | POA: Insufficient documentation

## 2011-12-15 DIAGNOSIS — G8929 Other chronic pain: Secondary | ICD-10-CM

## 2012-01-20 ENCOUNTER — Other Ambulatory Visit (HOSPITAL_COMMUNITY): Payer: Self-pay | Admitting: Family Medicine

## 2012-01-20 DIAGNOSIS — M545 Low back pain: Secondary | ICD-10-CM

## 2012-01-24 ENCOUNTER — Ambulatory Visit (HOSPITAL_COMMUNITY)
Admission: RE | Admit: 2012-01-24 | Discharge: 2012-01-24 | Disposition: A | Discharge status: Home / Self Care | Payer: Medicare HMO | Source: Ambulatory Visit | Attending: Family Medicine | Admitting: Family Medicine

## 2012-01-24 DIAGNOSIS — M545 Low back pain, unspecified: Secondary | ICD-10-CM | POA: Insufficient documentation

## 2012-01-24 DIAGNOSIS — M51379 Other intervertebral disc degeneration, lumbosacral region without mention of lumbar back pain or lower extremity pain: Secondary | ICD-10-CM | POA: Insufficient documentation

## 2012-01-24 DIAGNOSIS — M5137 Other intervertebral disc degeneration, lumbosacral region: Secondary | ICD-10-CM | POA: Insufficient documentation

## 2012-01-24 DIAGNOSIS — M79609 Pain in unspecified limb: Secondary | ICD-10-CM | POA: Insufficient documentation

## 2012-01-24 DIAGNOSIS — M5126 Other intervertebral disc displacement, lumbar region: Secondary | ICD-10-CM | POA: Insufficient documentation

## 2012-01-31 ENCOUNTER — Other Ambulatory Visit: Payer: Self-pay | Admitting: Family Medicine

## 2012-01-31 DIAGNOSIS — M545 Low back pain: Secondary | ICD-10-CM

## 2012-01-31 DIAGNOSIS — IMO0002 Reserved for concepts with insufficient information to code with codable children: Secondary | ICD-10-CM

## 2012-01-31 DIAGNOSIS — M5410 Radiculopathy, site unspecified: Secondary | ICD-10-CM

## 2012-02-22 ENCOUNTER — Ambulatory Visit
Admission: RE | Admit: 2012-02-22 | Discharge: 2012-02-22 | Disposition: A | Discharge status: Home / Self Care | Payer: Medicare HMO | Source: Ambulatory Visit | Attending: Family Medicine | Admitting: Family Medicine

## 2012-02-22 DIAGNOSIS — IMO0002 Reserved for concepts with insufficient information to code with codable children: Secondary | ICD-10-CM

## 2012-02-22 DIAGNOSIS — M5410 Radiculopathy, site unspecified: Secondary | ICD-10-CM

## 2012-02-22 DIAGNOSIS — M545 Low back pain: Secondary | ICD-10-CM

## 2012-02-22 MED ORDER — IOHEXOL 180 MG/ML  SOLN
1.0000 mL | Freq: Once | INTRAMUSCULAR | Status: AC | PRN
  Administered 2012-02-22: 1 mL via EPIDURAL

## 2012-02-22 MED ORDER — METHYLPREDNISOLONE ACETATE 40 MG/ML INJ SUSP (RADIOLOG
120.0000 mg | Freq: Once | INTRAMUSCULAR | Status: AC
  Administered 2012-02-22: 120 mg via EPIDURAL

## 2012-02-22 NOTE — Discharge Instructions (Signed)

## 2012-03-09 ENCOUNTER — Other Ambulatory Visit: Payer: Self-pay | Admitting: Family Medicine

## 2012-03-09 DIAGNOSIS — M79604 Pain in right leg: Secondary | ICD-10-CM

## 2012-03-09 DIAGNOSIS — M545 Low back pain: Secondary | ICD-10-CM

## 2012-03-19 ENCOUNTER — Ambulatory Visit
Admission: RE | Admit: 2012-03-19 | Discharge: 2012-03-19 | Disposition: A | Discharge status: Home / Self Care | Payer: Medicare HMO | Source: Ambulatory Visit | Attending: Family Medicine | Admitting: Family Medicine

## 2012-03-19 DIAGNOSIS — M545 Low back pain: Secondary | ICD-10-CM

## 2012-03-19 DIAGNOSIS — M79605 Pain in left leg: Secondary | ICD-10-CM

## 2012-03-19 MED ORDER — IOHEXOL 180 MG/ML  SOLN
1.0000 mL | Freq: Once | INTRAMUSCULAR | Status: AC | PRN
  Administered 2012-03-19: 1 mL via EPIDURAL

## 2012-03-19 MED ORDER — METHYLPREDNISOLONE ACETATE 40 MG/ML INJ SUSP (RADIOLOG
120.0000 mg | Freq: Once | INTRAMUSCULAR | Status: AC
  Administered 2012-03-19: 120 mg via EPIDURAL

## 2012-09-26 ENCOUNTER — Other Ambulatory Visit (HOSPITAL_COMMUNITY): Payer: Self-pay | Admitting: Family Medicine

## 2012-09-26 ENCOUNTER — Ambulatory Visit (HOSPITAL_COMMUNITY)
Admission: RE | Admit: 2012-09-26 | Discharge: 2012-09-26 | Disposition: A | Discharge status: Home / Self Care | Payer: Medicare HMO | Source: Ambulatory Visit | Attending: Family Medicine | Admitting: Family Medicine

## 2012-09-26 DIAGNOSIS — M169 Osteoarthritis of hip, unspecified: Secondary | ICD-10-CM | POA: Insufficient documentation

## 2012-09-26 DIAGNOSIS — R52 Pain, unspecified: Secondary | ICD-10-CM

## 2012-09-26 DIAGNOSIS — M79609 Pain in unspecified limb: Secondary | ICD-10-CM | POA: Insufficient documentation

## 2012-09-26 DIAGNOSIS — M2340 Loose body in knee, unspecified knee: Secondary | ICD-10-CM | POA: Insufficient documentation

## 2012-09-26 DIAGNOSIS — M25559 Pain in unspecified hip: Secondary | ICD-10-CM | POA: Insufficient documentation

## 2012-09-26 DIAGNOSIS — M1610 Unilateral primary osteoarthritis, unspecified hip: Secondary | ICD-10-CM | POA: Insufficient documentation

## 2013-01-03 ENCOUNTER — Ambulatory Visit (HOSPITAL_COMMUNITY)
Admission: RE | Admit: 2013-01-03 | Discharge: 2013-01-03 | Disposition: A | Discharge status: Home / Self Care | Payer: Medicare HMO | Source: Ambulatory Visit | Attending: Family Medicine | Admitting: Family Medicine

## 2013-01-03 ENCOUNTER — Other Ambulatory Visit (HOSPITAL_COMMUNITY): Payer: Self-pay | Admitting: Family Medicine

## 2013-01-03 DIAGNOSIS — G8929 Other chronic pain: Secondary | ICD-10-CM

## 2013-01-03 DIAGNOSIS — M545 Low back pain, unspecified: Secondary | ICD-10-CM | POA: Insufficient documentation

## 2013-01-03 DIAGNOSIS — M5137 Other intervertebral disc degeneration, lumbosacral region: Secondary | ICD-10-CM | POA: Insufficient documentation

## 2013-01-03 DIAGNOSIS — M51379 Other intervertebral disc degeneration, lumbosacral region without mention of lumbar back pain or lower extremity pain: Secondary | ICD-10-CM | POA: Insufficient documentation

## 2013-01-10 ENCOUNTER — Ambulatory Visit: Payer: Medicare HMO | Admitting: Orthopedic Surgery

## 2013-02-05 ENCOUNTER — Other Ambulatory Visit: Payer: Self-pay | Admitting: Family Medicine

## 2013-02-05 DIAGNOSIS — M549 Dorsalgia, unspecified: Secondary | ICD-10-CM

## 2013-02-20 ENCOUNTER — Other Ambulatory Visit: Payer: Medicare HMO

## 2013-11-22 ENCOUNTER — Other Ambulatory Visit (HOSPITAL_COMMUNITY): Payer: Self-pay | Admitting: Family Medicine

## 2013-11-22 ENCOUNTER — Ambulatory Visit (HOSPITAL_COMMUNITY)
Admission: RE | Admit: 2013-11-22 | Discharge: 2013-11-22 | Disposition: A | Discharge status: Home / Self Care | Payer: Medicare HMO | Source: Ambulatory Visit | Attending: Family Medicine | Admitting: Family Medicine

## 2013-11-22 DIAGNOSIS — M25552 Pain in left hip: Secondary | ICD-10-CM

## 2013-11-22 DIAGNOSIS — M169 Osteoarthritis of hip, unspecified: Secondary | ICD-10-CM | POA: Insufficient documentation

## 2013-11-22 DIAGNOSIS — M25559 Pain in unspecified hip: Secondary | ICD-10-CM | POA: Insufficient documentation

## 2013-11-22 DIAGNOSIS — M1610 Unilateral primary osteoarthritis, unspecified hip: Secondary | ICD-10-CM | POA: Insufficient documentation

## 2013-12-19 ENCOUNTER — Ambulatory Visit: Payer: Medicare HMO | Admitting: Orthopedic Surgery

## 2013-12-24 ENCOUNTER — Ambulatory Visit: Payer: Medicare HMO | Admitting: Orthopedic Surgery

## 2015-06-02 ENCOUNTER — Ambulatory Visit: Payer: Medicare HMO | Admitting: Urology

## 2015-10-21 ENCOUNTER — Emergency Department (HOSPITAL_COMMUNITY)
Admission: EM | Admit: 2015-10-21 | Discharge: 2015-10-21 | Disposition: A | Discharge status: Home / Self Care | Payer: Medicare HMO | Attending: Dermatology | Admitting: Dermatology

## 2015-10-21 ENCOUNTER — Encounter (HOSPITAL_COMMUNITY): Payer: Self-pay | Admitting: Emergency Medicine

## 2015-10-21 DIAGNOSIS — I10 Essential (primary) hypertension: Secondary | ICD-10-CM | POA: Diagnosis not present

## 2015-10-21 DIAGNOSIS — H9203 Otalgia, bilateral: Secondary | ICD-10-CM | POA: Diagnosis not present

## 2015-10-21 NOTE — ED Notes (Signed)
Pt c/o pain to bilateral ears x 2 days. Pt states he has history of having ears irrigated x 2 years ago.

## 2015-10-21 NOTE — ED Notes (Signed)
Per pharmacy tech, patient and family member walked out of department.

## 2016-07-04 ENCOUNTER — Other Ambulatory Visit (HOSPITAL_COMMUNITY): Payer: Self-pay | Admitting: Internal Medicine

## 2016-07-04 ENCOUNTER — Ambulatory Visit (HOSPITAL_COMMUNITY)
Admission: RE | Admit: 2016-07-04 | Discharge: 2016-07-04 | Disposition: A | Discharge status: Home / Self Care | Payer: Medicare HMO | Source: Ambulatory Visit | Attending: Internal Medicine | Admitting: Internal Medicine

## 2016-07-04 DIAGNOSIS — M545 Low back pain: Secondary | ICD-10-CM

## 2016-07-04 DIAGNOSIS — M47896 Other spondylosis, lumbar region: Secondary | ICD-10-CM | POA: Diagnosis not present

## 2016-09-25 ENCOUNTER — Encounter (HOSPITAL_COMMUNITY): Payer: Self-pay

## 2016-09-25 ENCOUNTER — Observation Stay (HOSPITAL_COMMUNITY)
Admission: EM | Admit: 2016-09-25 | Discharge: 2016-09-27 | Disposition: A | Discharge status: Home / Self Care | Payer: Medicare HMO | Attending: Internal Medicine | Admitting: Internal Medicine

## 2016-09-25 ENCOUNTER — Emergency Department (HOSPITAL_COMMUNITY): Payer: Medicare HMO

## 2016-09-25 DIAGNOSIS — M1990 Unspecified osteoarthritis, unspecified site: Secondary | ICD-10-CM | POA: Insufficient documentation

## 2016-09-25 DIAGNOSIS — Z9181 History of falling: Secondary | ICD-10-CM | POA: Diagnosis not present

## 2016-09-25 DIAGNOSIS — R7989 Other specified abnormal findings of blood chemistry: Secondary | ICD-10-CM | POA: Diagnosis not present

## 2016-09-25 DIAGNOSIS — N183 Chronic kidney disease, stage 3 (moderate): Secondary | ICD-10-CM | POA: Diagnosis not present

## 2016-09-25 DIAGNOSIS — I872 Venous insufficiency (chronic) (peripheral): Secondary | ICD-10-CM | POA: Insufficient documentation

## 2016-09-25 DIAGNOSIS — Z66 Do not resuscitate: Secondary | ICD-10-CM | POA: Diagnosis not present

## 2016-09-25 DIAGNOSIS — R55 Syncope and collapse: Secondary | ICD-10-CM | POA: Diagnosis not present

## 2016-09-25 DIAGNOSIS — M5416 Radiculopathy, lumbar region: Secondary | ICD-10-CM | POA: Diagnosis not present

## 2016-09-25 DIAGNOSIS — I7300 Raynaud's syndrome without gangrene: Secondary | ICD-10-CM | POA: Insufficient documentation

## 2016-09-25 DIAGNOSIS — R778 Other specified abnormalities of plasma proteins: Secondary | ICD-10-CM

## 2016-09-25 DIAGNOSIS — I129 Hypertensive chronic kidney disease with stage 1 through stage 4 chronic kidney disease, or unspecified chronic kidney disease: Secondary | ICD-10-CM | POA: Diagnosis not present

## 2016-09-25 DIAGNOSIS — R748 Abnormal levels of other serum enzymes: Secondary | ICD-10-CM | POA: Diagnosis present

## 2016-09-25 DIAGNOSIS — I447 Left bundle-branch block, unspecified: Secondary | ICD-10-CM | POA: Diagnosis present

## 2016-09-25 DIAGNOSIS — I440 Atrioventricular block, first degree: Secondary | ICD-10-CM | POA: Diagnosis not present

## 2016-09-25 DIAGNOSIS — R918 Other nonspecific abnormal finding of lung field: Secondary | ICD-10-CM | POA: Diagnosis not present

## 2016-09-25 DIAGNOSIS — N529 Male erectile dysfunction, unspecified: Secondary | ICD-10-CM | POA: Insufficient documentation

## 2016-09-25 DIAGNOSIS — N39498 Other specified urinary incontinence: Secondary | ICD-10-CM | POA: Diagnosis not present

## 2016-09-25 DIAGNOSIS — J309 Allergic rhinitis, unspecified: Secondary | ICD-10-CM | POA: Insufficient documentation

## 2016-09-25 DIAGNOSIS — I10 Essential (primary) hypertension: Secondary | ICD-10-CM | POA: Diagnosis present

## 2016-09-25 DIAGNOSIS — N401 Enlarged prostate with lower urinary tract symptoms: Secondary | ICD-10-CM | POA: Diagnosis not present

## 2016-09-25 DIAGNOSIS — Z8249 Family history of ischemic heart disease and other diseases of the circulatory system: Secondary | ICD-10-CM | POA: Diagnosis not present

## 2016-09-25 DIAGNOSIS — N138 Other obstructive and reflux uropathy: Secondary | ICD-10-CM | POA: Insufficient documentation

## 2016-09-25 DIAGNOSIS — E785 Hyperlipidemia, unspecified: Secondary | ICD-10-CM | POA: Insufficient documentation

## 2016-09-25 DIAGNOSIS — I517 Cardiomegaly: Secondary | ICD-10-CM | POA: Diagnosis not present

## 2016-09-25 DIAGNOSIS — F0390 Unspecified dementia without behavioral disturbance: Secondary | ICD-10-CM | POA: Insufficient documentation

## 2016-09-25 HISTORY — DX: Unspecified osteoarthritis, unspecified site: M19.90

## 2016-09-25 LAB — COMPREHENSIVE METABOLIC PANEL
ALBUMIN: 3.6 g/dL (ref 3.5–5.0)
ALT: 18 U/L (ref 17–63)
AST: 32 U/L (ref 15–41)
Alkaline Phosphatase: 64 U/L (ref 38–126)
Anion gap: 11 (ref 5–15)
BUN: 18 mg/dL (ref 6–20)
CHLORIDE: 103 mmol/L (ref 101–111)
CO2: 25 mmol/L (ref 22–32)
CREATININE: 1.49 mg/dL — AB (ref 0.61–1.24)
Calcium: 9.5 mg/dL (ref 8.9–10.3)
GFR calc Af Amer: 46 mL/min — ABNORMAL LOW (ref >60)
GFR calc non Af Amer: 40 mL/min — ABNORMAL LOW (ref >60)
GLUCOSE: 103 mg/dL — AB (ref 65–99)
Potassium: 3.8 mmol/L (ref 3.5–5.1)
SODIUM: 139 mmol/L (ref 135–145)
Total Bilirubin: 0.9 mg/dL (ref 0.3–1.2)
Total Protein: 6.5 g/dL (ref 6.5–8.1)

## 2016-09-25 LAB — CBC
HCT: 39.4 % (ref 39.0–52.0)
Hemoglobin: 13.1 g/dL (ref 13.0–17.0)
MCH: 30.9 pg (ref 26.0–34.0)
MCHC: 33.2 g/dL (ref 30.0–36.0)
MCV: 92.9 fL (ref 78.0–100.0)
PLATELETS: 187 10*3/uL (ref 150–400)
RBC: 4.24 MIL/uL (ref 4.22–5.81)
RDW: 12.4 % (ref 11.5–15.5)
WBC: 5.3 10*3/uL (ref 4.0–10.5)

## 2016-09-25 LAB — TROPONIN I: Troponin I: 0.14 ng/mL (ref <0.03)

## 2016-09-25 MED ORDER — ADULT MULTIVITAMIN W/MINERALS CH
1.0000 | ORAL_TABLET | Freq: Every day | ORAL | Status: DC
  Administered 2016-09-26 – 2016-09-27 (×2): 1 via ORAL
  Filled 2016-09-25 (×2): qty 1

## 2016-09-25 MED ORDER — ENOXAPARIN SODIUM 40 MG/0.4ML ~~LOC~~ SOLN
40.0000 mg | SUBCUTANEOUS | Status: DC
  Administered 2016-09-25 – 2016-09-26 (×2): 40 mg via SUBCUTANEOUS
  Filled 2016-09-25 (×2): qty 0.4

## 2016-09-25 MED ORDER — DONEPEZIL HCL 10 MG PO TABS
10.0000 mg | ORAL_TABLET | Freq: Every day | ORAL | Status: DC
  Administered 2016-09-26: 10 mg via ORAL
  Filled 2016-09-25: qty 1

## 2016-09-25 MED ORDER — SODIUM CHLORIDE 0.9% FLUSH
3.0000 mL | Freq: Two times a day (BID) | INTRAVENOUS | Status: DC
  Administered 2016-09-25 – 2016-09-26 (×3): 3 mL via INTRAVENOUS

## 2016-09-25 MED ORDER — ACETAMINOPHEN 500 MG PO TABS: 500.0000 mg | ORAL_TABLET | Freq: Four times a day (QID) | ORAL | Status: DC | PRN

## 2016-09-25 MED ORDER — ASPIRIN 81 MG PO CHEW
324.0000 mg | CHEWABLE_TABLET | Freq: Once | ORAL | Status: AC
  Administered 2016-09-25: 324 mg via ORAL
  Filled 2016-09-25: qty 4

## 2016-09-25 MED ORDER — HYDRALAZINE HCL 20 MG/ML IJ SOLN
10.0000 mg | Freq: Four times a day (QID) | INTRAMUSCULAR | Status: DC | PRN
  Administered 2016-09-25 – 2016-09-27 (×3): 10 mg via INTRAVENOUS
  Filled 2016-09-25 (×3): qty 1

## 2016-09-25 NOTE — ED Notes (Signed)
ED Provider at bedside. 

## 2016-09-25 NOTE — Progress Notes (Signed)
Call received from phlebotomy at [phone] requesting reschedule/confirm [id number] which was scheduled for 8 minutes prior to call.  Phlebotomy informed pt has just arrived on unit, knowledge of intention with orders is not known, would need to page Md. This nurse advised draw the lab, versus delay further in the event that they did want it done; as pt arriving could delay page/response from Md. interpreted by phleb as refusal to page md. Clarification provided to phlebotomy that was in fact not the case.   Page has been made, MD will review and adjust orders accordingly.   Page to Dr Jomarie LongsJoseph "[phone] lab requesting re-schedule/verification of troponin test due 8 minutes ago. Please call [phone] or phleb at [phone]."

## 2016-09-25 NOTE — ED Provider Notes (Addendum)
[facility]-EMERGENCY DEPT Provider Note   CSN: [id number] Arrival date & time: 09/25/16  1305     History   Chief Complaint Chief Complaint  Patient presents with  . Loss of Consciousness    HPI Rachel Samples is a 83 y.o. male.  HPI Patient presents to the emergency department after a syncopal episode while at church.  When EMS arrived the patient had O2 sats of 80% but these improved to 98% on room air by the time he arrived emergency department.  He had no prodromal symptoms.  Family thought he was sleeping initially but then when they laid him on the floor he continued to be unresponsive.  No seizure activity was noted.  No preceding chest pain.  No chest pain at this time.  Denies shortness of breath.  No recent illness.  Patient was in his normal state health earlier today.   Past Medical History:  Diagnosis Date  . Arthritis   . Hypertension     Patient Active Problem List   Diagnosis Date Noted  . 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 BACK PAIN, LUMBAR, WITH RADICULOPATHY 03/27/2008  . MEMORY LOSS 03/27/2008  . ALLERGIC RHINITIS 12/26/2007  . ERECTILE DYSFUNCTION 08/31/2007  . CONSTIPATION 03/22/2007  . ALLERGIC RHINITIS, SEASONAL 01/04/2007  . DEGENERATIVE JOINT DISEASE 12/27/2006  . BENIGN PROSTATIC HYPERTROPHY, WITH OBSTRUCTION 11/14/2006  . HYPERLIPIDEMIA 07/24/2006  . HYPERTENSION 07/24/2006  . VENTRICULAR HYPERTROPHY, LEFT 07/24/2006  . RAYNAUD'S DISEASE 07/24/2006  . VENOUS INSUFFICIENCY 07/24/2006  . COLONIC POLYPS, HX OF 07/24/2006    Past Surgical History:  Procedure Laterality Date  . HERNIA REPAIR         Home Medications    Prior to Admission medications   Medication Sig Start Date End Date Taking? Authorizing Provider  acetaminophen (TYLENOL) 500 MG tablet Take 500-1,000 mg by mouth every 6 (six) hours as needed for moderate pain.    Historical Provider, MD  donepezil (ARICEPT) 10 MG tablet Take 1 tablet by mouth 2 (two) times daily. 10/13/15    Historical Provider, MD  losartan-hydrochlorothiazide (HYZAAR) 100-25 MG tablet Take 1 tablet by mouth daily. 08/20/15   Historical Provider, MD  Multiple Vitamin (MULTIVITAMIN WITH MINERALS) TABS tablet Take 1 tablet by mouth daily.    Historical Provider, MD    Family History No family history on file.  Social History Social History  Substance Use Topics  . Smoking status: Never Smoker  . Smokeless tobacco: Never Used  . Alcohol use No     Allergies   Patient has no known allergies.   Review of Systems Review of Systems  All other systems reviewed and are negative.    Physical Exam Updated Vital Signs BP 187/60   Pulse (!) 51   Temp 97.4 F (36.3 C) (Oral)   Resp 24   Ht 5\' 9"  (1.753 m)   Wt 185 lb (83.9 kg)   SpO2 100%   BMI 27.32 kg/m   Physical Exam  Constitutional: He is oriented to person, place, and time. He appears well-developed and well-nourished.  HENT:  Head: Normocephalic and atraumatic.  Eyes: EOM are normal.  Neck: Normal range of motion.  Cardiovascular: Normal rate, regular rhythm, normal heart sounds and intact distal pulses.   Pulmonary/Chest: Effort normal and breath sounds normal. No respiratory distress.  Abdominal: Soft. He exhibits no distension. There is no tenderness.  Musculoskeletal: Normal range of motion.  Neurological: He is alert and oriented to person, place, and time.  Skin: Skin is  warm and dry.  Psychiatric: He has a normal mood and affect. Judgment normal.  Nursing note and vitals reviewed.    ED Treatments / Results  Labs (all labs ordered are listed, but only abnormal results are displayed) Labs Reviewed  COMPREHENSIVE METABOLIC PANEL - Abnormal; Notable for the following:       Result Value   Glucose, Bld 103 (*)    Creatinine, Ser 1.49 (*)    GFR calc non Af Amer 40 (*)    GFR calc Af Amer 46 (*)    All other components within normal limits  TROPONIN I - Abnormal; Notable for the following:    Troponin I  0.14 (*)    All other components within normal limits  CBC    EKG  EKG Interpretation  Date/Time:  Sunday September 25 2016 13:23:52 EST Ventricular Rate:  77 PR Interval:    QRS Duration: 134 QT Interval:  438 QTC Calculation: 496 R Axis:   52 Text Interpretation:  Sinus rhythm Prolonged PR interval Left bundle branch block LBBB is new since prior tracing in 2012 Confirmed by Jden Want  MD, Llewellyn Choplin ([id number]) on 09/25/2016 3:26:49 PM       Radiology No results found.  Procedures Procedures (including critical care time)  Medications Ordered in ED Medications - No data to display   Initial Impression / Assessment and Plan / ED Course  I have reviewed the triage vital signs and the nursing notes.  Pertinent labs & imaging results that were available during my care of the patient were reviewed by me and considered in my medical decision making (see chart for details).     Syncope.  Mildly elevated troponin.  New left bundle branch block as compared to prior EKG in 2012.  No chest pain or shortness of breath at this time.  A symptomatically.  The patient would like to go home.  I told the patient I do not think is appropriate to be discharged home at this time.  I've called for admission for elevated troponin and syncopal episode.  Patient family agreeable to admission.  5:22 PM Spoke with Dr Jomarie LongsJoseph, [facility] hospitalist. Will admit. Requests cardiology consultation. Will page Dr Mayford Knifeurner, cards  Final Clinical Impressions(s) / ED Diagnoses   Final diagnoses:  Syncope, unspecified syncope type  Elevated troponin    New Prescriptions New Prescriptions   No medications on file     Azalia BilisKevin Daimion Adamcik, MD 09/25/16 1713    Azalia BilisKevin Briar Sword, MD 09/25/16 1722

## 2016-09-25 NOTE — ED Notes (Signed)
PT [place] via EMS from church where PT had a witnessed syncopal episode. Per EMS, PT was standing and suddenly sat down. PT's family then noticed PT was unconscious. The church was very hot. PT was taken outside and came around within one minute. PT did have an episode of incontinence. EMS reports family did not report seizure activity. PT was 80% on room air when EMS arrived. PT was placed on 2L O2 for transport. PT was taken off of O2 and maintained sats at 98% on room air per EMS. EMS reports an EKG showed afib and left bundle branch block. PT's family reports no history of either. PT then converted to sinus in transport.

## 2016-09-25 NOTE — H&P (Signed)
History and Physical    Darin LeatherFreeman Joost EXB:[id number] DOB: 08/25/1924 DOA: 09/25/2016  Referring MD/NP/PA: EDP PCP: Wilson SingerGOSRANI,NIMISH C, MD  Patient coming from: church  Chief Complaint: passed out  HPI: [name] is a 83 y.o. male with medical history significant of Dementia, HTN, was at church today with his family when his family thought he dozed off then his family nudged him and he fell to the side and was unresponsive. Then regained consciousness in 5min, they deny any seizure activity. He is back to baseline in the ER now, denies any complaints and wants to go back home  ED Course: Found to have a LBBB compared to prior EKg in 2012 and troponin of 0.14  Review of Systems: As per HPI otherwise 10 point review of systems negative.   Past Medical History:  Diagnosis Date  . Arthritis   . Hypertension     Past Surgical History:  Procedure Laterality Date  . HERNIA REPAIR       reports that he has never smoked. He has never used smokeless tobacco. He reports that he does not drink alcohol or use drugs.  No Known Allergies  Family history : unknown due to dementia, pt unable to recall  Prior to Admission medications   Medication Sig Start Date End Date Taking? Authorizing Provider  acetaminophen (TYLENOL) 500 MG tablet Take 500-1,000 mg by mouth every 6 (six) hours as needed for moderate pain.    Historical Provider, MD  donepezil (ARICEPT) 10 MG tablet Take 1 tablet by mouth 2 (two) times daily. 10/13/15   Historical Provider, MD  losartan-hydrochlorothiazide (HYZAAR) 100-25 MG tablet Take 1 tablet by mouth daily. 08/20/15   Historical Provider, MD  Multiple Vitamin (MULTIVITAMIN WITH MINERALS) TABS tablet Take 1 tablet by mouth daily.    Historical Provider, MD    Physical Exam: Vitals:   09/25/16 1630 09/25/16 1645 09/25/16 1700 09/25/16 1715  BP: 146/62 138/78 152/78 144/70  Pulse: (!) 51 (!) 46 (!) 51 60  Resp: 16 23 16 21   Temp:      TempSrc:      SpO2: 100% 100%  99% 99%  Weight:      Height:          Constitutional: NAD, calm, comfortable Vitals:   09/25/16 1630 09/25/16 1645 09/25/16 1700 09/25/16 1715  BP: 146/62 138/78 152/78 144/70  Pulse: (!) 51 (!) 46 (!) 51 60  Resp: 16 23 16 21   Temp:      TempSrc:      SpO2: 100% 100% 99% 99%  Weight:      Height:       Eyes: PERRL, lids and conjunctivae normal ENMT: Mucous membranes are moist. Posterior pharynx clear of any exudate or lesions.Normal dentition.  Neck: normal, supple, no masses, no thyromegaly Respiratory: clear to auscultation bilaterally, no wheezing, no crackles. Normal respiratory effort. No accessory muscle use.  Cardiovascular: Regular rate and rhythm, no murmurs / rubs / gallops. No extremity edema. 2+ pedal pulses. No carotid bruits.  Abdomen: no tenderness, no masses palpated. No hepatosplenomegaly. Bowel sounds positive.  Musculoskeletal: no clubbing / cyanosis. No joint deformity upper and lower extremities. Good ROM, no contractures. Normal muscle tone.  Skin: no rashes, lesions, ulcers. No induration Neurologic: CN 2-12 grossly intact. Sensation intact, DTR normal. Strength 5/5 in all 4.  Psychiatric: Normal judgment and insight. Alert and oriented x 3. Normal mood.  Labs on Admission: I have personally reviewed following labs and imaging studies  CBC:  Recent Labs Lab 09/25/16 1545  WBC 5.3  HGB 13.1  HCT 39.4  MCV 92.9  PLT 187   Basic Metabolic Panel:  Recent Labs Lab 09/25/16 1545  NA 139  K 3.8  CL 103  CO2 25  GLUCOSE 103*  BUN 18  CREATININE 1.49*  CALCIUM 9.5   GFR: Estimated Creatinine Clearance: 33 mL/min (by C-G formula based on SCr of 1.49 mg/dL (H)). Liver Function Tests:  Recent Labs Lab 09/25/16 1545  AST 32  ALT 18  ALKPHOS 64  BILITOT 0.9  PROT 6.5  ALBUMIN 3.6   No results for input(s): LIPASE, AMYLASE in the last 168 hours. No results for input(s): AMMONIA in the last 168 hours. Coagulation Profile: No results  for input(s): INR, PROTIME in the last 168 hours. Cardiac Enzymes:  Recent Labs Lab 09/25/16 1545  TROPONINI 0.14*   BNP (last 3 results) No results for input(s): PROBNP in the last 8760 hours. HbA1C: No results for input(s): HGBA1C in the last 72 hours. CBG: No results for input(s): GLUCAP in the last 168 hours. Lipid Profile: No results for input(s): CHOL, HDL, LDLCALC, TRIG, CHOLHDL, LDLDIRECT in the last 72 hours. Thyroid Function Tests: No results for input(s): TSH, T4TOTAL, FREET4, T3FREE, THYROIDAB in the last 72 hours. Anemia Panel: No results for input(s): VITAMINB12, FOLATE, FERRITIN, TIBC, IRON, RETICCTPCT in the last 72 hours. Urine analysis:    Component Value Date/Time   COLORURINE yellow 10/22/2008 0859   APPEARANCEUR Clear 10/22/2008 0859   LABSPEC 1.015 10/22/2008 0859   PHURINE 6.0 10/22/2008 0859   GLUCOSEU NEGATIVE 05/08/2008 1755   HGBUR small 10/22/2008 0859   BILIRUBINUR negative 10/22/2008 0859   KETONESUR NEGATIVE 05/08/2008 1755   PROTEINUR NEGATIVE 05/08/2008 1755   UROBILINOGEN 0.2 10/22/2008 0859   NITRITE negative 10/22/2008 0859   LEUKOCYTESUR  05/08/2008 1755    NEGATIVE MICROSCOPIC NOT DONE ON URINES WITH NEGATIVE PROTEIN, BLOOD, LEUKOCYTES, NITRITE, OR GLUCOSE <1000 mg/dL.   Sepsis Labs: @LABRCNTIP (procalcitonin:4,lacticidven:4) )No results found for this or any previous visit (from the past 240 hour(s)).   Radiological Exams on Admission: No results found.  EKG: Independently reviewed. LBBB  Assessment/Plan   Syncope -concerning for cardiac syncope with new LBBB and elevated troponin -pt is asymptomatic from an ACS standpoint -will trend troponin -start ASA, no BB since HR in 50-60 range -check ECHO -monitor on Tele -check orthostatics -I called and d/w Cards Dr.Turner who agreed with current management and felt that Cards could consult tomorrow if needed    Essential hypertension -check orthostatics -Hold Hyzaar,  hydralazine PRN    LBBB (left bundle branch block) -as above, Troponin, ECHO -ASA,    Dementia  -continue aricept  DVT prophylaxis: lovenox Code Status: DNR, d/w son at bedside Family Communication: son and family at bedside Disposition Plan: home tomorrow Consults called: None will consult cards in am Admission status: observation   Zannie Cove MD [facility] Pager [phone]  If 7PM-7AM, please contact night-coverage [email or web address] Password [facility]  09/25/2016, 5:42 PM

## 2016-09-25 NOTE — ED Notes (Signed)
Patient transported to X-ray 

## 2016-09-25 NOTE — ED Notes (Signed)
EKG given to Dr. Campos 

## 2016-09-25 NOTE — ED Notes (Signed)
Changed pt clothing and placed into scrubs, pts clothing in bag with pt visitors.

## 2016-09-26 ENCOUNTER — Observation Stay (HOSPITAL_BASED_OUTPATIENT_CLINIC_OR_DEPARTMENT_OTHER): Payer: Medicare HMO

## 2016-09-26 ENCOUNTER — Encounter (HOSPITAL_COMMUNITY): Payer: Self-pay | Admitting: Cardiology

## 2016-09-26 DIAGNOSIS — I447 Left bundle-branch block, unspecified: Secondary | ICD-10-CM | POA: Diagnosis not present

## 2016-09-26 DIAGNOSIS — R55 Syncope and collapse: Secondary | ICD-10-CM

## 2016-09-26 DIAGNOSIS — R748 Abnormal levels of other serum enzymes: Secondary | ICD-10-CM

## 2016-09-26 DIAGNOSIS — G3183 Dementia with Lewy bodies: Secondary | ICD-10-CM | POA: Diagnosis not present

## 2016-09-26 DIAGNOSIS — I10 Essential (primary) hypertension: Secondary | ICD-10-CM

## 2016-09-26 DIAGNOSIS — F0280 Dementia in other diseases classified elsewhere without behavioral disturbance: Secondary | ICD-10-CM | POA: Diagnosis not present

## 2016-09-26 LAB — ECHOCARDIOGRAM COMPLETE
CHL CUP DOP CALC LVOT VTI: 22.9 cm
E/e' ratio: 7.45
EWDT: 275 ms
FS: 39 % (ref 28–44)
HEIGHTINCHES: 66 in
IV/PV OW: 0.89
LA vol A4C: 59.8 ml
LA vol: 65.8 mL
LADIAMINDEX: 1.73 cm/m2
LASIZE: 34 mm
LAVOLIN: 33.4 mL/m2
LDCA: 3.8 cm2
LEFT ATRIUM END SYS DIAM: 34 mm
LV E/e' medial: 7.45
LV E/e'average: 7.45
LV PW d: 13.3 mm — AB (ref 0.6–1.1)
LV TDI E'LATERAL: 7.62
LV TDI E'MEDIAL: 5.29
LV e' LATERAL: 7.62 cm/s
LVOT SV: 87 mL
LVOT peak vel: 109 cm/s
LVOTD: 22 mm
Lateral S' vel: 12.5 cm/s
MV Dec: 275
MV pk A vel: 108 m/s
MV pk E vel: 56.8 m/s
TAPSE: 24.4 mm
WEIGHTICAEL: 2868.8 [oz_av]

## 2016-09-26 LAB — BASIC METABOLIC PANEL
Anion gap: 10 (ref 5–15)
BUN: 13 mg/dL (ref 6–20)
CHLORIDE: 103 mmol/L (ref 101–111)
CO2: 26 mmol/L (ref 22–32)
Calcium: 9.9 mg/dL (ref 8.9–10.3)
Creatinine, Ser: 1.34 mg/dL — ABNORMAL HIGH (ref 0.61–1.24)
GFR calc non Af Amer: 45 mL/min — ABNORMAL LOW (ref >60)
GFR, EST AFRICAN AMERICAN: 52 mL/min — AB (ref >60)
Glucose, Bld: 99 mg/dL (ref 65–99)
POTASSIUM: 3.9 mmol/L (ref 3.5–5.1)
SODIUM: 139 mmol/L (ref 135–145)

## 2016-09-26 LAB — CBC
HEMATOCRIT: 42 % (ref 39.0–52.0)
HEMOGLOBIN: 14 g/dL (ref 13.0–17.0)
MCH: 30.7 pg (ref 26.0–34.0)
MCHC: 33.3 g/dL (ref 30.0–36.0)
MCV: 92.1 fL (ref 78.0–100.0)
Platelets: 191 10*3/uL (ref 150–400)
RBC: 4.56 MIL/uL (ref 4.22–5.81)
RDW: 12.4 % (ref 11.5–15.5)
WBC: 4.9 10*3/uL (ref 4.0–10.5)

## 2016-09-26 LAB — TROPONIN I
TROPONIN I: 0.17 ng/mL — AB (ref <0.03)
Troponin I: 0.16 ng/mL (ref <0.03)
Troponin I: 0.17 ng/mL (ref <0.03)

## 2016-09-26 MED ORDER — LORAZEPAM 0.5 MG PO TABS
0.5000 mg | ORAL_TABLET | Freq: Two times a day (BID) | ORAL | Status: DC
  Administered 2016-09-26 – 2016-09-27 (×3): 0.5 mg via ORAL
  Filled 2016-09-26 (×3): qty 1

## 2016-09-26 MED ORDER — LOSARTAN POTASSIUM 50 MG PO TABS
100.0000 mg | ORAL_TABLET | Freq: Every day | ORAL | Status: DC
  Administered 2016-09-26 – 2016-09-27 (×2): 100 mg via ORAL
  Filled 2016-09-26 (×2): qty 2

## 2016-09-26 MED ORDER — LORAZEPAM 0.5 MG PO TABS: 0.5000 mg | ORAL_TABLET | Freq: Every day | ORAL | Status: DC

## 2016-09-26 MED ORDER — HYDROCHLOROTHIAZIDE 25 MG PO TABS
25.0000 mg | ORAL_TABLET | Freq: Every day | ORAL | Status: DC
  Administered 2016-09-26 – 2016-09-27 (×2): 25 mg via ORAL
  Filled 2016-09-26 (×2): qty 1

## 2016-09-26 NOTE — Progress Notes (Signed)
  Echocardiogram 2D Echocardiogram has been performed.  Delcie RochENNINGTON, Yousra Ivens 09/26/2016, 11:56 AM

## 2016-09-26 NOTE — Progress Notes (Signed)
Patient was helped to the chair by another nurse, nurse informed me that there was no chair alarm. I go to get him a chair alarm and he was not in the room. A 4th floor nurse called to let us know he was up there. Patient in bed with bed alarm on, call bell in reach. Educated about calling for help and not to leave the room. Teach back used and he demonstrated using the call lights on call bell and bed.

## 2016-09-26 NOTE — Consult Note (Signed)
Cardiology Consult    Patient ID: Darin LeatherFreeman Ogren MRN: [id number], DOB/AGE: 04/23/1924   Admit date: 09/25/2016 Date of Consult: 09/26/2016  Primary Physician: Wilson SingerGOSRANI,NIMISH C, MD Reason for Consult: New LBBB and elevated troponin Primary Cardiologist: New- Dr. Antoine PocheHochrein Requesting Provider: Dr. Benjamine MolaVann   History of Present Illness    [name] is a 83 y.o. with past medical history significant for dementia, HTN and arthritis who presented to [facility] for evaluation of brief loss of consciousness witnessed by his family. We have been asked to consult for evaluation of new left bundle branch block and elevated troponin.  Pt has dementia and poor recollection of events but he was at church and began to feel warm and he dozed off. His family noted him to first be sleeping with his eyes closed and softly snoring. He then went limp and became incontinent of bladder and they could not arouse him. They laid him on the floor and called 911. The patient regained consciousness after 5-10 minutes per family, but could not get up off the floor.   At his baseline he is confused. He walks to the mall close[place]by and rides the bus and has been doing this up until this episode. He falls asleep easily when he sits down according to the family, but has had no recent changes in his activity. He takes excessive amounts of OTC pain meds for his back pain according to his wife, like extra-strength Tylenol, Aleve, arthritis strength Tylenol.  EKG shows Sinus rhythm at 77 bpm with 1st degree AV block and new LBBB (since 2012) with discordant STE 2-3 mm, no acute ischemia at present. Troponins 0.14-->0.17. K+ 3.9, Cr 1.49 --> 1.34  He had remote evaluation for chest pain and bradycardia in 2008. Holter monitor at that time showed no significant rhythm abnormality. A stress myoview was recommended, I do not see the result.  Past Medical History   Past Medical History:  Diagnosis Date  . Arthritis   .  Hypertension     Past Surgical History:  Procedure Laterality Date  . HERNIA REPAIR       Allergies  No Known Allergies  Inpatient Medications    . donepezil  10 mg Oral QHS  . enoxaparin (LOVENOX) injection  40 mg Subcutaneous Q24H  . LORazepam  0.5 mg Oral QHS  . multivitamin with minerals  1 tablet Oral Daily  . sodium chloride flush  3 mL Intravenous Q12H    Family History    Family History  Problem Relation Age of Onset  . CAD Father     died of MI in his 980's    Social History    Social History   Social History  . Marital status: Married    Spouse name: N/A  . Number of children: N/A  . Years of education: N/A   Occupational History  . Not on file.   Social History Main Topics  . Smoking status: Never Smoker  . Smokeless tobacco: Never Used  . Alcohol use No  . Drug use: No  . Sexual activity: Not on file   Other Topics Concern  . Not on file   Social History Narrative  . No narrative on file     Review of Systems    General:  No chills, fever, night sweats or weight changes.  Cardiovascular:  No chest pain, dyspnea on exertion, edema, orthopnea, palpitations, paroxysmal nocturnal dyspnea. Dermatological: No rash, lesions/masses Respiratory: No cough, dyspnea Urologic: No hematuria,  dysuria Abdominal:   No nausea, vomiting, diarrhea, bright red blood per rectum, melena, or hematemesis Neurologic:  No visual changes, wkns, changes in mental status. All other systems reviewed and are otherwise negative except as noted above.  Physical Exam   Blood pressure (!) 135/59, pulse (!) 52, temperature 97.4 F (36.3 C), temperature source Oral, resp. rate 18, height 5\' 6"  (1.676 m), weight 179 lb 4.8 oz (81.3 kg), SpO2 99 %.  General: Pleasant, confused, AA male, NAD Psych: Normal affect. Neuro: Alert and oriented to self. Moves all extremities spontaneously. HEENT: Normal  Neck: Supple without bruits or JVD. Lungs:  Resp regular and unlabored,  CTA. Heart: RRR no s3, s4, or murmurs. Abdomen: Soft, non-tender, non-distended, BS + x 4.  Extremities: No clubbing, cyanosis or edema. DP/PT/Radials 2+ and equal bilaterally.  Labs    Troponin (Point of Care Test) No results for input(s): TROPIPOC in the last 72 hours.  Recent Labs  09/25/16 1545 09/26/16 0725 09/26/16 1322  TROPONINI 0.14* 0.17* 0.16*   Lab Results  Component Value Date   WBC 4.9 09/26/2016   HGB 14.0 09/26/2016   HCT 42.0 09/26/2016   MCV 92.1 09/26/2016   PLT 191 09/26/2016     Recent Labs Lab 09/25/16 1545 09/26/16 0701  NA 139 139  K 3.8 3.9  CL 103 103  CO2 25 26  BUN 18 13  CREATININE 1.49* 1.34*  CALCIUM 9.5 9.9  PROT 6.5  --   BILITOT 0.9  --   ALKPHOS 64  --   ALT 18  --   AST 32  --   GLUCOSE 103* 99   Lab Results  Component Value Date   CHOL 197 10/24/2008   HDL 70 10/24/2008   LDLCALC 114 (H) 10/24/2008   TRIG 66 10/24/2008   No results found for: [employer]   Radiology Studies    Dg Chest 2 View  Result Date: 09/25/2016 CLINICAL DATA:  Syncope. EXAM: CHEST  2 VIEW COMPARISON:  09/02/2010 chest radiograph FINDINGS: Cardiomegaly identified. Mild peribronchial thickening again noted. There is no evidence of focal airspace disease, pulmonary edema, suspicious pulmonary nodule/mass, pleural effusion, or pneumothorax. No acute bony abnormalities are identified. IMPRESSION: Cardiomegaly without evidence of acute cardiopulmonary disease. Electronically Signed   By: Harmon Pier M.D.   On: 09/25/2016 17:49    EKG & Cardiac Imaging   EKG: Sinus rhythm at 77 bpm with 1st degree AV block and new LBBB with discordant STE 2-3 mm, no acute ischemia at present  Echocardiogram:  09/26/2016: Study Conclusions - Left ventricle: The cavity size was normal. Wall thickness was   increased in a pattern of mild LVH. Septal bounce consistent with   LBBB. Systolic function was normal. The estimated ejection   fraction was in the range of 55% to  60%. Wall motion was normal;   there were no regional wall motion abnormalities. Doppler   parameters are consistent with abnormal left ventricular   relaxation (grade 1 diastolic dysfunction). - Aortic valve: There was no stenosis. - Aorta: Mildly dilated aortic root. Aortic root dimension: 37 mm   (ED). - Mitral valve: There was no significant regurgitation. - Left atrium: The atrium was mildly dilated. - Right ventricle: The cavity size was normal. Systolic function   was normal. - Pulmonary arteries: No complete TR doppler jet so unable to   estimate PA systolic pressure. - Inferior vena cava: The vessel was normal in size. The   respirophasic diameter changes were in  the normal range (= 50%),   consistent with normal central venous pressure.  Impressions: - Normal LV size with mild LV hypertrophy. EF 55-60%. Septal bounce   consistent with LBBB, otherwise wall motion normal. Normal RV   size and systolic function. No significant valvular   abnormalities.   Assessment & Plan    Syncope -Pt lost consciousness with loss of bladder control at church, recovered consciousness spontaneously after 5-10 minutes per family. -Pt has dementia, recalls feeling warm and falling asleep prior to event, does not recall dizziness, chest pain or palpitations.  -No arrhythmias or pauses noted on tele, occ bradycardia noted in the 50's and rare in the 40's since admission. HR mostly 60's-80's. Not on rate limiting medications like beta-blocker. Has history of bradycardia and passing out twice many years ago -No valvular abnormalities on echo.  New LBBB -New since last EKG in 2012, Discordant ST elevation not indicative of ischemia  Elevated troponins -Troponins mildly elevated and fairly flat, 0.14, 0.17 -No chest pain or shortness of breath. Pt appears euvolemic. -Echo shows normal LVEF with mild LVH, septal wall bounce consistent with LBBB.  -Not consistent with ACS -Will check lexiscan  myoview  Hypertension -Losartan-HCTZ currently on hold due to syncope -BP elevated on admission, 214/96, will resume Bp meds. -Currently running SBP 135-197. -Continue to monitor.  -Check orthostatic VS  Signed, Berton Bon, NP-C 09/26/2016, 4:13 PM Pager: [phone]  History and all data above reviewed.  Patient examined.  I agree with the findings as above.  The patient exam reveals COR:RRR  ,  Lungs: Clear  ,  Abd: Positive bowel sounds, no rebound no guarding, Ext No edema  .  All available labs, radiology testing, previous records reviewed. Agree with documented assessment and plan. Syncope:  Odd sequence of events.  He was unresponsive for an extended period of time. He was bradycardic on initial evaluation by EMS.  However, since admission there have been no symptomatic bradyarrhythmias.  He does have a LBBB but we have no idea how old this is.  He does have an elevated troponin but no symptoms consistent with an acute ischemic syndrome.  We will plan an in patient Lexiscan Myoview.  He will likely need an out patient Holter.  Resume is meds for HTN and check then check orthostatic BPs.     Fayrene Fearing Aslan Montagna  4:14 PM  09/26/2016

## 2016-09-26 NOTE — Progress Notes (Signed)
PROGRESS NOTE    Darin LeatherFreeman Robley  AVW:[id number] DOB: 02-15-1924 DOA: 09/25/2016 PCP: Wilson SingerGOSRANI,NIMISH C, MD   Outpatient Specialists:     Brief Narrative:   [name] is a 83 y.o. male with medical history significant of Dementia, HTN, was at church today with his family when his family thought he dozed off then his family nudged him and he fell to the side and was unresponsive. Then regained consciousness in 5min, they deny any seizure activity. He is back to baseline in the ER now, denies any complaints and wants to go back home   Assessment & Plan:   Active Problems:   Essential hypertension   LBBB (left bundle branch block)   Syncope and collapse   Syncope   New LBBB -troponins slightly elevated -patient denies chest pain  Syncope -orthostatics negative -echo results pending  CKD stage III -stable since 2012    DVT prophylaxis:  SCD's  Code Status: DNR   Family Communication:   Disposition Plan:    Consultants:   cards     Subjective: Wants to go home C/o low back pain  Objective: Vitals:   09/25/16 2015 09/25/16 2159 09/26/16 0046 09/26/16 0438  BP:  135/65 (!) 151/59 (!) 177/64  Pulse: 65  (!) 56 83  Resp:    16  Temp: 97.9 F (36.6 C)  98 F (36.7 C) 98.3 F (36.8 C)  TempSrc: Oral  Oral Oral  SpO2:    98%  Weight:    81.3 kg (179 lb 4.8 oz)  Height:        Intake/Output Summary (Last 24 hours) at 09/26/16 1202 Last data filed at 09/26/16 1002  Gross per 24 hour  Intake              583 ml  Output              900 ml  Net             -317 ml   Filed Weights   09/25/16 1326 09/25/16 1828 09/26/16 0438  Weight: 83.9 kg (185 lb) 82.5 kg (181 lb 12.8 oz) 81.3 kg (179 lb 4.8 oz)    Examination:  General exam: Appears calm and comfortable  Respiratory system: Clear to auscultation. Respiratory effort normal. Cardiovascular system: S1 & S2 heard, RRR. No JVD, murmurs, rubs, gallops or clicks. No pedal edema. Gastrointestinal  system: Abdomen is nondistended, soft and nontender. No organomegaly or masses felt. Normal bowel sounds heard. Central nervous system: Alert and oriented. No focal neurological deficits.     Data Reviewed: I have personally reviewed following labs and imaging studies  CBC:  Recent Labs Lab 09/25/16 1545 09/26/16 0701  WBC 5.3 4.9  HGB 13.1 14.0  HCT 39.4 42.0  MCV 92.9 92.1  PLT 187 191   Basic Metabolic Panel:  Recent Labs Lab 09/25/16 1545 09/26/16 0701  NA 139 139  K 3.8 3.9  CL 103 103  CO2 25 26  GLUCOSE 103* 99  BUN 18 13  CREATININE 1.49* 1.34*  CALCIUM 9.5 9.9   GFR: Estimated Creatinine Clearance: 36.7 mL/min (by C-G formula based on SCr of 1.34 mg/dL (H)). Liver Function Tests:  Recent Labs Lab 09/25/16 1545  AST 32  ALT 18  ALKPHOS 64  BILITOT 0.9  PROT 6.5  ALBUMIN 3.6   No results for input(s): LIPASE, AMYLASE in the last 168 hours. No results for input(s): AMMONIA in the last 168 hours. Coagulation Profile: No results for input(s):  INR, PROTIME in the last 168 hours. Cardiac Enzymes:  Recent Labs Lab 09/25/16 1545 09/26/16 0725  TROPONINI 0.14* 0.17*   BNP (last 3 results) No results for input(s): PROBNP in the last 8760 hours. HbA1C: No results for input(s): HGBA1C in the last 72 hours. CBG: No results for input(s): GLUCAP in the last 168 hours. Lipid Profile: No results for input(s): CHOL, HDL, LDLCALC, TRIG, CHOLHDL, LDLDIRECT in the last 72 hours. Thyroid Function Tests: No results for input(s): TSH, T4TOTAL, FREET4, T3FREE, THYROIDAB in the last 72 hours. Anemia Panel: No results for input(s): VITAMINB12, FOLATE, FERRITIN, TIBC, IRON, RETICCTPCT in the last 72 hours. Urine analysis:    Component Value Date/Time   COLORURINE yellow 10/22/2008 0859   APPEARANCEUR Clear 10/22/2008 0859   LABSPEC 1.015 10/22/2008 0859   PHURINE 6.0 10/22/2008 0859   GLUCOSEU NEGATIVE 05/08/2008 1755   HGBUR small 10/22/2008 0859    BILIRUBINUR negative 10/22/2008 0859   KETONESUR NEGATIVE 05/08/2008 1755   PROTEINUR NEGATIVE 05/08/2008 1755   UROBILINOGEN 0.2 10/22/2008 0859   NITRITE negative 10/22/2008 0859   LEUKOCYTESUR  05/08/2008 1755    NEGATIVE MICROSCOPIC NOT DONE ON URINES WITH NEGATIVE PROTEIN, BLOOD, LEUKOCYTES, NITRITE, OR GLUCOSE <1000 mg/dL.     )No results found for this or any previous visit (from the past 240 hour(s)).    Anti-infectives    None       Radiology Studies: Dg Chest 2 View  Result Date: 09/25/2016 CLINICAL DATA:  Syncope. EXAM: CHEST  2 VIEW COMPARISON:  09/02/2010 chest radiograph FINDINGS: Cardiomegaly identified. Mild peribronchial thickening again noted. There is no evidence of focal airspace disease, pulmonary edema, suspicious pulmonary nodule/mass, pleural effusion, or pneumothorax. No acute bony abnormalities are identified. IMPRESSION: Cardiomegaly without evidence of acute cardiopulmonary disease. Electronically Signed   By: Harmon Pier M.D.   On: 09/25/2016 17:49        Scheduled Meds: . donepezil  10 mg Oral QHS  . enoxaparin (LOVENOX) injection  40 mg Subcutaneous Q24H  . LORazepam  0.5 mg Oral QHS  . multivitamin with minerals  1 tablet Oral Daily  . sodium chloride flush  3 mL Intravenous Q12H   Continuous Infusions:   LOS: 0 days    Time spent: 25 min    JESSICA U VANN, DO [facility] Pager [phone]  If 7PM-7AM, please contact night-coverage [email or web address] Password [email or web address] 09/26/2016, 12:02 PM

## 2016-09-27 ENCOUNTER — Other Ambulatory Visit: Payer: Self-pay | Admitting: Physician Assistant

## 2016-09-27 ENCOUNTER — Observation Stay (HOSPITAL_BASED_OUTPATIENT_CLINIC_OR_DEPARTMENT_OTHER): Payer: Medicare HMO

## 2016-09-27 DIAGNOSIS — I447 Left bundle-branch block, unspecified: Secondary | ICD-10-CM | POA: Diagnosis not present

## 2016-09-27 DIAGNOSIS — F0280 Dementia in other diseases classified elsewhere without behavioral disturbance: Secondary | ICD-10-CM | POA: Diagnosis not present

## 2016-09-27 DIAGNOSIS — R0789 Other chest pain: Secondary | ICD-10-CM | POA: Diagnosis not present

## 2016-09-27 DIAGNOSIS — R55 Syncope and collapse: Secondary | ICD-10-CM | POA: Diagnosis not present

## 2016-09-27 DIAGNOSIS — G3183 Dementia with Lewy bodies: Secondary | ICD-10-CM | POA: Diagnosis not present

## 2016-09-27 LAB — NM MYOCAR MULTI W/SPECT W/WALL MOTION / EF
CHL CUP MPHR: 130 {beats}/min
CSEPED: 8 min
CSEPEW: 1 METS
CSEPPHR: 112 {beats}/min
Exercise duration (sec): 52 s
Percent HR: 86 %
Rest HR: 75 {beats}/min

## 2016-09-27 LAB — TROPONIN I: Troponin I: 0.22 ng/mL (ref <0.03)

## 2016-09-27 MED ORDER — REGADENOSON 0.4 MG/5ML IV SOLN
INTRAVENOUS | Status: AC
  Filled 2016-09-27: qty 5

## 2016-09-27 MED ORDER — REGADENOSON 0.4 MG/5ML IV SOLN
0.4000 mg | Freq: Once | INTRAVENOUS | Status: AC
  Administered 2016-09-27: 0.4 mg via INTRAVENOUS
  Filled 2016-09-27: qty 5

## 2016-09-27 MED ORDER — DONEPEZIL HCL 10 MG PO TABS: 10.0000 mg | ORAL_TABLET | Freq: Every day | ORAL | Status: AC

## 2016-09-27 MED ORDER — TECHNETIUM TC 99M TETROFOSMIN IV KIT
30.0000 | PACK | Freq: Once | INTRAVENOUS | Status: AC | PRN
  Administered 2016-09-27: 30 via INTRAVENOUS

## 2016-09-27 MED ORDER — TECHNETIUM TC 99M TETROFOSMIN IV KIT
10.0000 | PACK | Freq: Once | INTRAVENOUS | Status: AC | PRN
  Administered 2016-09-27: 10 via INTRAVENOUS

## 2016-09-27 NOTE — Progress Notes (Signed)
Lexiscan MV performed, 1 day study, [place] to read.  Theodore DemarkBarrett, Blayklee Mable, Cordelia Poche-C 09/27/2016 10:31 AM Beeper [phone]

## 2016-09-27 NOTE — Discharge Summary (Signed)
Physician Discharge Summary  [name] [id number] DOB: 1924/07/23 DOA: 09/25/2016  PCP: Wilson SingerGOSRANI,NIMISH C, MD  Admit date: 09/25/2016 Discharge date: 09/27/2016   Recommendations for Outpatient Follow-Up:   1. 30 day event monitor 2. Dose of aricept adjusted   Discharge Diagnosis:   Active Problems:   Essential hypertension   LBBB (left bundle branch block)   Syncope and collapse   Syncope   Discharge disposition:  Home with supervision due to dementia  Discharge Condition: Improved.  Diet recommendation: Low sodium, heart healthy  Wound care: None.   History of Present Illness:    [name] is a 83 y.o. male with medical history significant of Dementia, HTN, was at church today with his family when his family thought he dozed off then his family nudged him and he fell to the side and was unresponsive. Then regained consciousness in 5min, they deny any seizure activity. He is back to baseline in the ER now, denies any complaints and wants to go back home   Hospital Course by Problem:   New LBBB -troponins slightly elevated -patient denies chest pain -low risk stress test  Syncope -orthostatics negative -echo: Normal LV size with mild LV hypertrophy. EF 55-60%. Septal bounce consistent with LBBB, otherwise wall motion normal. Normal RV size and systolic function. No significant valvular abnormalities. Will need 30 day event monitor  CKD stage III -stable since 2012    Medical Consultants:    cards   Discharge Exam:   Vitals:   09/27/16 1036 09/27/16 1220  BP: (!) 187/80 (!) 154/73  Pulse: 93 85  Resp:  18  Temp:  97.4 F (36.3 C)   Vitals:   09/27/16 1034 09/27/16 1035 09/27/16 1036 09/27/16 1220  BP: (!) 90/56 104/62 (!) 187/80 (!) 154/73  Pulse: (!) 106 89 93 85  Resp:    18  Temp:    97.4 F (36.3 C)  TempSrc:    Oral  SpO2:    95%  Weight:      Height:        Gen:  NAD- pleasant and cooperative  The results of  significant diagnostics from this hospitalization (including imaging, microbiology, ancillary and laboratory) are listed below for reference.     Procedures and Diagnostic Studies:   Dg Chest 2 View  Result Date: 09/25/2016 CLINICAL DATA:  Syncope. EXAM: CHEST  2 VIEW COMPARISON:  09/02/2010 chest radiograph FINDINGS: Cardiomegaly identified. Mild peribronchial thickening again noted. There is no evidence of focal airspace disease, pulmonary edema, suspicious pulmonary nodule/mass, pleural effusion, or pneumothorax. No acute bony abnormalities are identified. IMPRESSION: Cardiomegaly without evidence of acute cardiopulmonary disease. Electronically Signed   By: Harmon PierJeffrey  Hu M.D.   On: 09/25/2016 17:49     Labs:   Basic Metabolic Panel:  Recent Labs Lab 09/25/16 1545 09/26/16 0701  NA 139 139  K 3.8 3.9  CL 103 103  CO2 25 26  GLUCOSE 103* 99  BUN 18 13  CREATININE 1.49* 1.34*  CALCIUM 9.5 9.9   GFR Estimated Creatinine Clearance: 36.4 mL/min (by C-G formula based on SCr of 1.34 mg/dL (H)). Liver Function Tests:  Recent Labs Lab 09/25/16 1545  AST 32  ALT 18  ALKPHOS 64  BILITOT 0.9  PROT 6.5  ALBUMIN 3.6   No results for input(s): LIPASE, AMYLASE in the last 168 hours. No results for input(s): AMMONIA in the last 168 hours. Coagulation profile No results for input(s): INR, PROTIME in the last 168 hours.  CBC:  Recent Labs Lab 09/25/16 1545 09/26/16 0701  WBC 5.3 4.9  HGB 13.1 14.0  HCT 39.4 42.0  MCV 92.9 92.1  PLT 187 191   Cardiac Enzymes:  Recent Labs Lab 09/25/16 1545 09/26/16 0725 09/26/16 1322 09/26/16 1849 09/27/16 0142  TROPONINI 0.14* 0.17* 0.16* 0.17* 0.22*   BNP: Invalid input(s): POCBNP CBG: No results for input(s): GLUCAP in the last 168 hours. D-Dimer No results for input(s): DDIMER in the last 72 hours. Hgb A1c No results for input(s): HGBA1C in the last 72 hours. Lipid Profile No results for input(s): CHOL, HDL, LDLCALC,  TRIG, CHOLHDL, LDLDIRECT in the last 72 hours. Thyroid function studies No results for input(s): TSH, T4TOTAL, T3FREE, THYROIDAB in the last 72 hours.  Invalid input(s): FREET3 Anemia work up No results for input(s): VITAMINB12, FOLATE, FERRITIN, TIBC, IRON, RETICCTPCT in the last 72 hours. Microbiology No results found for this or any previous visit (from the past 240 hour(s)).   Discharge Instructions:   Discharge Instructions    Diet - low sodium heart healthy    Complete by:  As directed    Discharge instructions    Complete by:  As directed    Would not take aricept BID until you speak with your primary MD- instead take once/daily-- this medication can slow your heart   Increase activity slowly    Complete by:  As directed      Allergies as of 09/27/2016   No Known Allergies     Medication List    TAKE these medications   acetaminophen 500 MG tablet Commonly known as:  TYLENOL Take 500-1,000 mg by mouth every 6 (six) hours as needed for moderate pain.   ALEVE 220 MG tablet Generic drug:  naproxen sodium Take 220 mg by mouth 2 (two) times daily as needed (pain).   aspirin EC 81 MG tablet Take 81 mg by mouth daily.   BIOFREEZE EX Apply 1 application topically 5 (five) times daily as needed (back pain).   donepezil 10 MG tablet Commonly known as:  ARICEPT Take 1 tablet (10 mg total) by mouth at bedtime. What changed:  when to take this   Fish Oil 1000 MG Caps Take 1,000 mg by mouth daily.   GARLIC PO Take 1 capsule by mouth daily.   ICY HOT LIDOCAINE PLUS MENTHOL EX Apply 1 application topically 5 (five) times daily as needed (back pain).   LORazepam 0.5 MG tablet Commonly known as:  ATIVAN Take 0.5 mg by mouth daily as needed for anxiety.   losartan-hydrochlorothiazide 100-25 MG tablet Commonly known as:  HYZAAR Take 1 tablet by mouth daily.   multivitamin with minerals Tabs tablet Take 1 tablet by mouth daily.   SENNA LAXATIVE 25 MG  Tabs Generic drug:  Sennosides Take 25 mg by mouth every other day.   Vitamin D-3 5000 units Tabs Take 5,000 Units by mouth daily.      Follow-up Information    [name], MD Follow up in 1 week(s).   Specialty:  Internal Medicine Contact information: [address] [place] [address] [phone]            Time coordinating discharge: 32 min  Signed:  Menelik Mcfarren U Wylie Coon   [facility] 09/27/2016, 2:46 PM

## 2016-09-27 NOTE — Progress Notes (Deleted)
Error

## 2016-09-27 NOTE — Progress Notes (Signed)
Nuclear medicine called, will be getting patient this morning.

## 2016-09-27 NOTE — Progress Notes (Signed)
Troponin 0.22, MD notified.

## 2016-09-27 NOTE — Progress Notes (Signed)
Progress Note  Patient Name: [name] Date of Encounter: 09/27/2016  Primary Cardiologist: Dr. Antoine Poche (new)  Subjective   He denies chest pain.  No SOB.   Inpatient Medications    Scheduled Meds: . donepezil  10 mg Oral QHS  . enoxaparin (LOVENOX) injection  40 mg Subcutaneous Q24H  . hydrochlorothiazide  25 mg Oral Daily  . LORazepam  0.5 mg Oral BID  . losartan  100 mg Oral Daily  . multivitamin with minerals  1 tablet Oral Daily  . sodium chloride flush  3 mL Intravenous Q12H   Continuous Infusions:  PRN Meds: acetaminophen, hydrALAZINE   Vital Signs    Vitals:   09/26/16 1939 09/27/16 0110 09/27/16 0200 09/27/16 0632  BP: (!) 151/83 (!) 178/76 (!) 155/66 (!) 148/63  Pulse: 60 73  85  Resp: 18   18  Temp: 98.3 F (36.8 C)   98.2 F (36.8 C)  TempSrc: Oral   Oral  SpO2: 100%   98%  Weight:    176 lb 4.8 oz (80 kg)  Height:        Intake/Output Summary (Last 24 hours) at 09/27/16 1003 Last data filed at 09/27/16 0600  Gross per 24 hour  Intake              563 ml  Output              325 ml  Net              238 ml   Filed Weights   09/25/16 1828 09/26/16 0438 09/27/16 1610  Weight: 181 lb 12.8 oz (82.5 kg) 179 lb 4.8 oz (81.3 kg) 176 lb 4.8 oz (80 kg)    Telemetry    NSR with PACs and sinus tachy.  No sustained pauses. - Personally Reviewed  ECG    NA - Personally Reviewed  Physical Exam   GEN: No acute distress.   Neck: No JVD Cardiac: RRR, no murmurs, rubs, or gallops.  Respiratory: Clear to auscultation bilaterally. GI: Soft, nontender, non-distended  MS: No edema; No deformity. Neuro:  Nonfocal  Psych: Normal affect   Labs    Chemistry Recent Labs Lab 09/25/16 1545 09/26/16 0701  NA 139 139  K 3.8 3.9  CL 103 103  CO2 25 26  GLUCOSE 103* 99  BUN 18 13  CREATININE 1.49* 1.34*  CALCIUM 9.5 9.9  PROT 6.5  --   ALBUMIN 3.6  --   AST 32  --   ALT 18  --   ALKPHOS 64  --   BILITOT 0.9  --   GFRNONAA 40* 45*    GFRAA 46* 52*  ANIONGAP 11 10     Hematology Recent Labs Lab 09/25/16 1545 09/26/16 0701  WBC 5.3 4.9  RBC 4.24 4.56  HGB 13.1 14.0  HCT 39.4 42.0  MCV 92.9 92.1  MCH 30.9 30.7  MCHC 33.2 33.3  RDW 12.4 12.4  PLT 187 191    Cardiac Enzymes Recent Labs Lab 09/26/16 0725 09/26/16 1322 09/26/16 1849 09/27/16 0142  TROPONINI 0.17* 0.16* 0.17* 0.22*   No results for input(s): TROPIPOC in the last 168 hours.   BNPNo results for input(s): BNP, PROBNP in the last 168 hours.   DDimer No results for input(s): DDIMER in the last 168 hours.   Radiology    Dg Chest 2 View  Result Date: 09/25/2016 CLINICAL DATA:  Syncope. EXAM: CHEST  2 VIEW COMPARISON:  09/02/2010 chest radiograph FINDINGS:  Cardiomegaly identified. Mild peribronchial thickening again noted. There is no evidence of focal airspace disease, pulmonary edema, suspicious pulmonary nodule/mass, pleural effusion, or pneumothorax. No acute bony abnormalities are identified. IMPRESSION: Cardiomegaly without evidence of acute cardiopulmonary disease. Electronically Signed   By: Harmon PierJeffrey  Hu M.D.   On: 09/25/2016 17:49    Cardiac Studies   ECHO:  - Left ventricle: The cavity size was normal. Wall thickness was   increased in a pattern of mild LVH. Septal bounce consistent with   LBBB. Systolic function was normal. The estimated ejection   fraction was in the range of 55% to 60%. Wall motion was normal;   there were no regional wall motion abnormalities. Doppler   parameters are consistent with abnormal left ventricular   relaxation (grade 1 diastolic dysfunction). - Aortic valve: There was no stenosis. - Aorta: Mildly dilated aortic root. Aortic root dimension: 37 mm   (ED). - Mitral valve: There was no significant regurgitation. - Left atrium: The atrium was mildly dilated. - Right ventricle: The cavity size was normal. Systolic function   was normal. - Pulmonary arteries: No complete TR doppler jet so unable to    estimate PA systolic pressure. - Inferior vena cava: The vessel was normal in size. The   respirophasic diameter changes were in the normal range (= 50%),   consistent with normal central venous pressure.   Lexiscan Myoview.  1. No reversible ischemia or infarction.  2. Normal left ventricular wall motion.  3. Left ventricular ejection fraction 49%  4. Non invasive risk stratification*: Low risk  Patient Profile     [name] is a 83 y.o. with past medical history significant for dementia, HTN and arthritis [facility] consciousness witnessed by his family. We were asked to consult for evaluation of new left bundle branch block and elevated troponin.  Assessment & Plan    SYNCOPE:  No clear etiology.  He will need an out patient event monitor.    ELEVATED TROPONIN:    Troponin trend is flat.   Low risk perfusion study.  No further work up.    Signed, Rollene RotundaJames Velinda Wrobel, MD  09/27/2016, 10:03 AM

## 2016-09-27 NOTE — Progress Notes (Addendum)
Patient is alert and oriented with son at the bedside, discharged with 30 day cardiac monitoring, office will call for follow up appt.

## 2016-10-03 ENCOUNTER — Ambulatory Visit (INDEPENDENT_AMBULATORY_CARE_PROVIDER_SITE_OTHER): Payer: Medicare HMO

## 2016-10-03 DIAGNOSIS — R55 Syncope and collapse: Secondary | ICD-10-CM | POA: Diagnosis not present

## 2016-10-31 NOTE — Progress Notes (Deleted)
Cardiology Office Note   Date:  10/31/2016   ID:  [name], DOB 09-Sep-1923, MRN [id number]  PCP:  Wilson SingerGOSRANI,NIMISH C, MD  Cardiologist:   Rollene RotundaJames Abiel Antrim, MD    No chief complaint on file.     History of Present Illness: [name] is a [age] y.o. male who presents for evaluation of possible syncope.  I saw him in the hospital.  He did have a LBBB and troponins were slightly elevated.  Echo showed a normal EF.  He was sent home for an event monitor.  ***   New LBBB -troponins slightly elevated -patient denies chest pain -low risk stress test  Syncope -orthostatics negative -echo: Normal LV size with mild LV hypertrophy. EF 55-60%. Septal bounce consistent with LBBB, otherwise wall motion normal. Normal RV size and systolic function. No significant valvular abnormalities. Will need 30 day event monitor  CKD stage III -stable since 2012  Past Medical History:  Diagnosis Date  . Arthritis   . Hypertension     Past Surgical History:  Procedure Laterality Date  . HERNIA REPAIR       Current Outpatient Prescriptions  Medication Sig Dispense Refill  . acetaminophen (TYLENOL) 500 MG tablet Take 500-1,000 mg by mouth every 6 (six) hours as needed for moderate pain.    Marland Kitchen. aspirin EC 81 MG tablet Take 81 mg by mouth daily.    . Cholecalciferol (VITAMIN D-3) 5000 units TABS Take 5,000 Units by mouth daily.    Marland Kitchen. donepezil (ARICEPT) 10 MG tablet Take 1 tablet (10 mg total) by mouth at bedtime.    Marland Kitchen. GARLIC PO Take 1 capsule by mouth daily.    . Lidocaine-Menthol (ICY HOT LIDOCAINE PLUS MENTHOL EX) Apply 1 application topically 5 (five) times daily as needed (back pain).    . LORazepam (ATIVAN) 0.5 MG tablet Take 0.5 mg by mouth daily as needed for anxiety.    Marland Kitchen. losartan-hydrochlorothiazide (HYZAAR) 100-25 MG tablet Take 1 tablet by mouth daily.    . Menthol, Topical Analgesic, (BIOFREEZE EX) Apply 1 application topically 5 (five) times daily as needed (back pain).    .  Multiple Vitamin (MULTIVITAMIN WITH MINERALS) TABS tablet Take 1 tablet by mouth daily.    . naproxen sodium (ALEVE) 220 MG tablet Take 220 mg by mouth 2 (two) times daily as needed (pain).    . Omega-3 Fatty Acids (FISH OIL) 1000 MG CAPS Take 1,000 mg by mouth daily.    . Sennosides (SENNA LAXATIVE) 25 MG TABS Take 25 mg by mouth every other day.     No current facility-administered medications for this visit.     Allergies:   Patient has no known allergies.    ROS:  Please see the history of present illness.   Otherwise, review of systems are positive for {NONE DEFAULTED:18576::"none"}.   All other systems are reviewed and negative.    PHYSICAL EXAM: VS:  There were no vitals taken for this visit. , BMI There is no height or weight on file to calculate BMI. GENERAL:  Well appearing NECK:  No jugular venous distention, waveform within normal limits, carotid upstroke brisk and symmetric, no bruits, no thyromegaly LUNGS:  Clear to auscultation bilaterally BACK:  No CVA tenderness CHEST:  Unremarkable HEART:  PMI not displaced or sustained,S1 and S2 within normal limits, no S3, no S4, no clicks, no rubs, *** murmurs ABD:  Flat, positive bowel sounds normal in frequency in pitch, no bruits, no rebound, no guarding, no midline  pulsatile mass, no hepatomegaly, no splenomegaly EXT:  2 plus pulses throughout, no edema, no cyanosis no clubbing    EKG:  EKG {ACTION; IS/IS ZOX:09604540} ordered today. The ekg ordered today demonstrates ***   Recent Labs: 09/25/2016: ALT 18 09/26/2016: BUN 13; Creatinine, Ser 1.34; Hemoglobin 14.0; Platelets 191; Potassium 3.9; Sodium 139    Lipid Panel    Component Value Date/Time   CHOL 197 10/24/2008 2107   TRIG 66 10/24/2008 2107   HDL 70 10/24/2008 2107   CHOLHDL 2.8 Ratio 10/24/2008 2107   VLDL 13 10/24/2008 2107   LDLCALC 114 (H) 10/24/2008 2107      Wt Readings from Last 3 Encounters:  09/27/16 176 lb 4.8 oz (80 kg)  10/21/15 180 lb (81.6  kg)  09/26/11 192 lb (87.1 kg)      Other studies Reviewed: Additional studies/ records that were reviewed today include: ***. Review of the above records demonstrates:  Please see elsewhere in the note.  ***   ASSESSMENT AND PLAN:  *** SYNCOPE:  ***  CKD:  ***  Current medicines are reviewed at length with the patient today.  The patient {ACTIONS; HAS/DOES NOT HAVE:19233} concerns regarding medicines.  The following changes have been made:  {PLAN; NO CHANGE:13088:s}  Labs/ tests ordered today include: *** No orders of the defined types were placed in this encounter.    Disposition:   FU with ***    Signed, Rollene Rotunda, MD  10/31/2016 9:38 PM    [facility]

## 2016-11-01 ENCOUNTER — Ambulatory Visit: Payer: Medicare HMO | Admitting: Cardiology

## 2017-05-03 ENCOUNTER — Other Ambulatory Visit (HOSPITAL_COMMUNITY): Payer: Self-pay | Admitting: Pulmonary Disease

## 2017-05-03 DIAGNOSIS — R609 Edema, unspecified: Secondary | ICD-10-CM

## 2017-05-04 ENCOUNTER — Ambulatory Visit (HOSPITAL_COMMUNITY)
Admission: RE | Admit: 2017-05-04 | Discharge: 2017-05-04 | Disposition: A | Discharge status: Home / Self Care | Payer: Medicare HMO | Source: Ambulatory Visit | Attending: Pulmonary Disease | Admitting: Pulmonary Disease

## 2017-05-04 DIAGNOSIS — R609 Edema, unspecified: Secondary | ICD-10-CM | POA: Diagnosis not present

## 2017-05-04 DIAGNOSIS — E785 Hyperlipidemia, unspecified: Secondary | ICD-10-CM | POA: Insufficient documentation

## 2017-05-04 DIAGNOSIS — I10 Essential (primary) hypertension: Secondary | ICD-10-CM | POA: Insufficient documentation

## 2017-05-04 DIAGNOSIS — I340 Nonrheumatic mitral (valve) insufficiency: Secondary | ICD-10-CM | POA: Diagnosis not present

## 2017-05-04 DIAGNOSIS — R55 Syncope and collapse: Secondary | ICD-10-CM | POA: Diagnosis not present

## 2017-05-04 DIAGNOSIS — I447 Left bundle-branch block, unspecified: Secondary | ICD-10-CM | POA: Insufficient documentation

## 2017-05-04 LAB — ECHOCARDIOGRAM COMPLETE
AVLVOTPG: 3 mmHg
CHL CUP DOP CALC LVOT VTI: 17.8 cm
CHL CUP RV SYS PRESS: 38 mmHg
CHL CUP STROKE VOLUME: 54 mL
CHL CUP TV REG PEAK VELOCITY: 296 cm/s
E decel time: 169 msec
E/e' ratio: 10.23
FS: 29 % (ref 28–44)
IV/PV OW: 1.18
LA ID, A-P, ES: 37 mm
LA diam end sys: 37 mm
LADIAMINDEX: 1.9 cm/m2
LAVOL: 73.1 mL
LAVOLA4C: 73.6 mL
LAVOLIN: 37.5 mL/m2
LV PW d: 13 mm — AB (ref 0.6–1.1)
LV sys vol index: 22 mL/m2
LVDIAVOL: 97 mL (ref 62–150)
LVDIAVOLIN: 50 mL/m2
LVEEAVG: 10.23
LVEEMED: 10.23
LVELAT: 6.53 cm/s
LVOT area: 3.46 cm2
LVOT diameter: 21 mm
LVOT peak vel: 79.9 cm/s
LVOTSV: 62 mL
LVSYSVOL: 43 mL (ref 21–61)
MV Dec: 169
MVPKAVEL: 77.1 m/s
MVPKEVEL: 66.8 m/s
Simpson's disk: 56
TAPSE: 17.8 mm
TDI e' lateral: 6.53
TDI e' medial: 5
TR max vel: 296 cm/s

## 2017-05-04 NOTE — Progress Notes (Signed)
*  PRELIMINARY RESULTS* Echocardiogram 2D Echocardiogram has been performed.  Stacey DrainWhite, Cheral Cappucci J 05/04/2017, 12:27 PM

## 2017-05-30 ENCOUNTER — Encounter (HOSPITAL_COMMUNITY): Payer: Self-pay

## 2017-05-30 ENCOUNTER — Emergency Department (HOSPITAL_COMMUNITY): Payer: Medicare HMO

## 2017-05-30 ENCOUNTER — Inpatient Hospital Stay (HOSPITAL_COMMUNITY): Payer: Medicare HMO

## 2017-05-30 ENCOUNTER — Inpatient Hospital Stay (HOSPITAL_COMMUNITY)
Admission: EM | Admit: 2017-05-30 | Discharge: 2017-06-06 | DRG: 082 | Disposition: A | Discharge status: Skilled Nursing Facility | Payer: Medicare HMO | Attending: Internal Medicine | Admitting: Internal Medicine

## 2017-05-30 DIAGNOSIS — J690 Pneumonitis due to inhalation of food and vomit: Secondary | ICD-10-CM | POA: Diagnosis present

## 2017-05-30 DIAGNOSIS — I10 Essential (primary) hypertension: Secondary | ICD-10-CM | POA: Diagnosis present

## 2017-05-30 DIAGNOSIS — N182 Chronic kidney disease, stage 2 (mild): Secondary | ICD-10-CM | POA: Diagnosis present

## 2017-05-30 DIAGNOSIS — Y9301 Activity, walking, marching and hiking: Secondary | ICD-10-CM | POA: Diagnosis present

## 2017-05-30 DIAGNOSIS — Y92410 Unspecified street and highway as the place of occurrence of the external cause: Secondary | ICD-10-CM

## 2017-05-30 DIAGNOSIS — W1839XA Other fall on same level, initial encounter: Secondary | ICD-10-CM | POA: Diagnosis present

## 2017-05-30 DIAGNOSIS — S065X9A Traumatic subdural hemorrhage with loss of consciousness of unspecified duration, initial encounter: Secondary | ICD-10-CM | POA: Diagnosis present

## 2017-05-30 DIAGNOSIS — E876 Hypokalemia: Secondary | ICD-10-CM | POA: Diagnosis not present

## 2017-05-30 DIAGNOSIS — S0990XA Unspecified injury of head, initial encounter: Secondary | ICD-10-CM | POA: Diagnosis not present

## 2017-05-30 DIAGNOSIS — M549 Dorsalgia, unspecified: Secondary | ICD-10-CM | POA: Diagnosis present

## 2017-05-30 DIAGNOSIS — M545 Low back pain: Secondary | ICD-10-CM | POA: Diagnosis present

## 2017-05-30 DIAGNOSIS — I609 Nontraumatic subarachnoid hemorrhage, unspecified: Secondary | ICD-10-CM

## 2017-05-30 DIAGNOSIS — Z8249 Family history of ischemic heart disease and other diseases of the circulatory system: Secondary | ICD-10-CM

## 2017-05-30 DIAGNOSIS — S40012A Contusion of left shoulder, initial encounter: Secondary | ICD-10-CM | POA: Diagnosis present

## 2017-05-30 DIAGNOSIS — S066X9A Traumatic subarachnoid hemorrhage with loss of consciousness of unspecified duration, initial encounter: Principal | ICD-10-CM | POA: Diagnosis present

## 2017-05-30 DIAGNOSIS — R339 Retention of urine, unspecified: Secondary | ICD-10-CM | POA: Diagnosis not present

## 2017-05-30 DIAGNOSIS — Z7982 Long term (current) use of aspirin: Secondary | ICD-10-CM | POA: Diagnosis not present

## 2017-05-30 DIAGNOSIS — R338 Other retention of urine: Secondary | ICD-10-CM

## 2017-05-30 DIAGNOSIS — I4891 Unspecified atrial fibrillation: Secondary | ICD-10-CM | POA: Diagnosis present

## 2017-05-30 DIAGNOSIS — Z66 Do not resuscitate: Secondary | ICD-10-CM | POA: Diagnosis present

## 2017-05-30 DIAGNOSIS — R413 Other amnesia: Secondary | ICD-10-CM | POA: Diagnosis present

## 2017-05-30 DIAGNOSIS — S065XAA Traumatic subdural hemorrhage with loss of consciousness status unknown, initial encounter: Secondary | ICD-10-CM

## 2017-05-30 DIAGNOSIS — I129 Hypertensive chronic kidney disease with stage 1 through stage 4 chronic kidney disease, or unspecified chronic kidney disease: Secondary | ICD-10-CM | POA: Diagnosis present

## 2017-05-30 DIAGNOSIS — N179 Acute kidney failure, unspecified: Secondary | ICD-10-CM | POA: Diagnosis present

## 2017-05-30 DIAGNOSIS — R509 Fever, unspecified: Secondary | ICD-10-CM | POA: Diagnosis not present

## 2017-05-30 DIAGNOSIS — F0390 Unspecified dementia without behavioral disturbance: Secondary | ICD-10-CM | POA: Diagnosis present

## 2017-05-30 LAB — COMPREHENSIVE METABOLIC PANEL
ALBUMIN: 4.4 g/dL (ref 3.5–5.0)
ALT: 22 U/L (ref 17–63)
ANION GAP: 12 (ref 5–15)
AST: 29 U/L (ref 15–41)
Alkaline Phosphatase: 77 U/L (ref 38–126)
BILIRUBIN TOTAL: 0.7 mg/dL (ref 0.3–1.2)
BUN: 21 mg/dL — ABNORMAL HIGH (ref 6–20)
CO2: 25 mmol/L (ref 22–32)
Calcium: 9.5 mg/dL (ref 8.9–10.3)
Chloride: 99 mmol/L — ABNORMAL LOW (ref 101–111)
Creatinine, Ser: 1.2 mg/dL (ref 0.61–1.24)
GFR calc non Af Amer: 51 mL/min — ABNORMAL LOW (ref >60)
GFR, EST AFRICAN AMERICAN: 59 mL/min — AB (ref >60)
GLUCOSE: 133 mg/dL — AB (ref 65–99)
POTASSIUM: 3.6 mmol/L (ref 3.5–5.1)
SODIUM: 136 mmol/L (ref 135–145)
TOTAL PROTEIN: 7.5 g/dL (ref 6.5–8.1)

## 2017-05-30 LAB — CBC WITH DIFFERENTIAL/PLATELET
BASOS ABS: 0 10*3/uL (ref 0.0–0.1)
BASOS PCT: 0 %
EOS ABS: 0 10*3/uL (ref 0.0–0.7)
EOS PCT: 1 %
HEMATOCRIT: 42.1 % (ref 39.0–52.0)
Hemoglobin: 13.9 g/dL (ref 13.0–17.0)
Lymphocytes Relative: 8 %
Lymphs Abs: 0.5 10*3/uL — ABNORMAL LOW (ref 0.7–4.0)
MCH: 31.2 pg (ref 26.0–34.0)
MCHC: 33 g/dL (ref 30.0–36.0)
MCV: 94.6 fL (ref 78.0–100.0)
MONO ABS: 0.2 10*3/uL (ref 0.1–1.0)
Monocytes Relative: 4 %
NEUTROS ABS: 5.3 10*3/uL (ref 1.7–7.7)
Neutrophils Relative %: 87 %
Platelets: 171 10*3/uL (ref 150–400)
RBC: 4.45 MIL/uL (ref 4.22–5.81)
RDW: 12.8 % (ref 11.5–15.5)
WBC: 6.1 10*3/uL (ref 4.0–10.5)

## 2017-05-30 LAB — MRSA PCR SCREENING: MRSA BY PCR: NEGATIVE

## 2017-05-30 LAB — TROPONIN I
TROPONIN I: 0.17 ng/mL — AB (ref <0.03)
Troponin I: 0.25 ng/mL (ref <0.03)

## 2017-05-30 MED ORDER — ACETAMINOPHEN 650 MG RE SUPP
650.0000 mg | RECTAL | Status: DC | PRN
  Administered 2017-05-31: 650 mg via RECTAL
  Filled 2017-05-30: qty 1

## 2017-05-30 MED ORDER — VITAMIN D 1000 UNITS PO TABS
5000.0000 [IU] | ORAL_TABLET | Freq: Every day | ORAL | Status: DC
  Administered 2017-05-31 – 2017-06-06 (×6): 5000 [IU] via ORAL
  Filled 2017-05-30 (×7): qty 5

## 2017-05-30 MED ORDER — HYDROCHLOROTHIAZIDE 25 MG PO TABS
25.0000 mg | ORAL_TABLET | Freq: Once | ORAL | Status: AC
  Administered 2017-05-30: 25 mg via ORAL
  Filled 2017-05-30: qty 1

## 2017-05-30 MED ORDER — STROKE: EARLY STAGES OF RECOVERY BOOK
Freq: Once | Status: AC
  Administered 2017-05-30: 22:00:00
  Filled 2017-05-30 (×2): qty 1

## 2017-05-30 MED ORDER — ONDANSETRON 4 MG PO TBDP: 4.0000 mg | ORAL_TABLET | Freq: Four times a day (QID) | ORAL | Status: DC | PRN

## 2017-05-30 MED ORDER — LOSARTAN POTASSIUM-HCTZ 100-25 MG PO TABS: 1.0000 | ORAL_TABLET | Freq: Every day | ORAL | Status: DC

## 2017-05-30 MED ORDER — SENNA 8.6 MG PO TABS
ORAL_TABLET | ORAL | Status: DC
  Administered 2017-05-31 – 2017-06-06 (×3): 8.6 mg via ORAL
  Filled 2017-05-30 (×6): qty 1

## 2017-05-30 MED ORDER — AMLODIPINE BESYLATE 5 MG PO TABS
5.0000 mg | ORAL_TABLET | Freq: Once | ORAL | Status: AC
  Administered 2017-05-30: 5 mg via ORAL
  Filled 2017-05-30: qty 1

## 2017-05-30 MED ORDER — SODIUM CHLORIDE 0.9 % IV SOLN
INTRAVENOUS | Status: DC
  Administered 2017-05-30 – 2017-06-03 (×6): via INTRAVENOUS

## 2017-05-30 MED ORDER — PANTOPRAZOLE SODIUM 40 MG PO PACK
40.0000 mg | PACK | Freq: Every day | ORAL | Status: DC
  Filled 2017-05-30: qty 20

## 2017-05-30 MED ORDER — DOCUSATE SODIUM 100 MG PO CAPS
100.0000 mg | ORAL_CAPSULE | Freq: Two times a day (BID) | ORAL | Status: DC
  Administered 2017-05-31 (×2): 100 mg via ORAL
  Filled 2017-05-30 (×4): qty 1

## 2017-05-30 MED ORDER — HYDRALAZINE HCL 20 MG/ML IJ SOLN
10.0000 mg | INTRAMUSCULAR | Status: DC | PRN
  Administered 2017-05-31 – 2017-06-03 (×4): 10 mg via INTRAVENOUS
  Filled 2017-05-30 (×4): qty 1

## 2017-05-30 MED ORDER — PANTOPRAZOLE SODIUM 40 MG PO TBEC
40.0000 mg | DELAYED_RELEASE_TABLET | Freq: Every day | ORAL | Status: DC
  Administered 2017-05-31: 40 mg via ORAL
  Filled 2017-05-30 (×2): qty 1

## 2017-05-30 MED ORDER — HYDROCHLOROTHIAZIDE 25 MG PO TABS
25.0000 mg | ORAL_TABLET | Freq: Every day | ORAL | Status: DC
  Administered 2017-05-31 – 2017-06-02 (×3): 25 mg via ORAL
  Filled 2017-05-30 (×3): qty 1

## 2017-05-30 MED ORDER — SODIUM CHLORIDE 0.9 % IV SOLN
500.0000 mg | Freq: Two times a day (BID) | INTRAVENOUS | Status: DC
  Filled 2017-05-30 (×2): qty 5

## 2017-05-30 MED ORDER — ADULT MULTIVITAMIN W/MINERALS CH
1.0000 | ORAL_TABLET | Freq: Every day | ORAL | Status: DC
  Administered 2017-05-31 – 2017-06-06 (×6): 1 via ORAL
  Filled 2017-05-30 (×7): qty 1

## 2017-05-30 MED ORDER — LOSARTAN POTASSIUM 50 MG PO TABS
100.0000 mg | ORAL_TABLET | Freq: Every day | ORAL | Status: DC
  Administered 2017-05-31 – 2017-06-02 (×3): 100 mg via ORAL
  Filled 2017-05-30 (×4): qty 2

## 2017-05-30 MED ORDER — LOSARTAN POTASSIUM 50 MG PO TABS
100.0000 mg | ORAL_TABLET | Freq: Once | ORAL | Status: AC
  Administered 2017-05-30: 100 mg via ORAL
  Filled 2017-05-30: qty 2

## 2017-05-30 MED ORDER — DONEPEZIL HCL 10 MG PO TABS
10.0000 mg | ORAL_TABLET | Freq: Every day | ORAL | Status: DC
  Administered 2017-05-31 – 2017-06-05 (×6): 10 mg via ORAL
  Filled 2017-05-30 (×7): qty 1

## 2017-05-30 MED ORDER — ONDANSETRON HCL 4 MG/2ML IJ SOLN: 4.0000 mg | Freq: Four times a day (QID) | INTRAMUSCULAR | Status: DC | PRN

## 2017-05-30 MED ORDER — LORAZEPAM 0.5 MG PO TABS
0.5000 mg | ORAL_TABLET | Freq: Every day | ORAL | Status: DC | PRN
  Administered 2017-05-31 – 2017-06-04 (×4): 0.5 mg via ORAL
  Filled 2017-05-30 (×4): qty 1

## 2017-05-30 MED ORDER — ACETAMINOPHEN 500 MG PO TABS: 500.0000 mg | ORAL_TABLET | Freq: Four times a day (QID) | ORAL | Status: DC | PRN

## 2017-05-30 MED ORDER — ACETAMINOPHEN 325 MG PO TABS
650.0000 mg | ORAL_TABLET | ORAL | Status: DC | PRN
  Administered 2017-05-31: 650 mg via ORAL
  Filled 2017-05-30 (×2): qty 2

## 2017-05-30 MED ORDER — LEVETIRACETAM IN NACL 500 MG/100ML IV SOLN
500.0000 mg | Freq: Two times a day (BID) | INTRAVENOUS | Status: DC
  Administered 2017-05-30: 500 mg via INTRAVENOUS
  Filled 2017-05-30 (×3): qty 100

## 2017-05-30 MED ORDER — ASPIRIN EC 81 MG PO TBEC
81.0000 mg | DELAYED_RELEASE_TABLET | Freq: Every day | ORAL | Status: DC
  Administered 2017-05-31: 81 mg via ORAL
  Filled 2017-05-30: qty 1

## 2017-05-30 MED ORDER — ACETAMINOPHEN 160 MG/5ML PO SOLN: 650.0000 mg | ORAL | Status: DC | PRN

## 2017-05-30 NOTE — ED Notes (Signed)
Have paged pharmacy to come and deliver Keppra

## 2017-05-30 NOTE — ED Notes (Signed)
CRITICAL VALUE ALERT  Critical Value:  Troponin 0.17  Date & Time Notied:  05/30/2017 1814  Provider Notified: Dr. Nelson Chimes  Orders Received/Actions taken:Notfied MD

## 2017-05-30 NOTE — Care Management Note (Signed)
Case Management Note  Patient Details  Name: Giovanne Nickolson MRN: [id number] Date of Birth: [birth year]  Subjective/Objective:   From home alone, patient s/p fall now with SAH/SDH per CT, neurosurgery following , patient is for another CT tomorrow.   Daughter is support person.  Await pt/ot eval.                  Action/Plan: NCM will follow for dc needs.   Expected Discharge Date:                  Expected Discharge Plan:     In-House Referral:     Discharge planning Services  CM Consult  Post Acute Care Choice:    Choice offered to:     DME Arranged:    DME Agency:     HH Arranged:    HH Agency:     Status of Service:  In process, will continue to follow  If discussed at Long Length of Stay Meetings, dates discussed:    Additional Comments:  Leone Haven, RN 05/30/2017, 10:06 PM

## 2017-05-30 NOTE — H&P (Signed)
History and Physical    [name] EAV:[id number] DOB: [birth year] DOA: 05/30/2017  PCP: [name], MD Patient coming from: Home  Chief Complaint: Fall  HPI: [name] is a 83 y.o. male with medical history significant of hypertension, dementia was brought to the ER by the family for evaluation of fall. Patient is slightly confused but somewhat is at his baseline per the daughter at bedside. History per daughter. Daughter states patient went out for his routine walk to the mall this morning where the bystanders saw him fall and hit his head on the ground. Due to this patient was brought to the ER for further evaluation. Patient denies any warning signs prior to the fall but does admit he often gets dizzy during change in position but tries to be careful due to this. He was admitted secondary to an episode of syncope back in January 2018 at [facility] and was found to have new left bundle branch block and a stress test showed low risk, echocardiogram showed ejection fraction 55-60 percent otherwise no significant valvular abnormality. Per daughter patient is otherwise healthy as he could be for a 83 year old and has his routine activity of going to the mall daily and walking. Most the times he goes by himself with the help of cane and performs his own basic tasks. In the past several months he has had 2 or 3 episodes of "black outs" per the daughter lasting several seconds but unable to give me any further detailed information.  In the ER due to the fall CT of the head was done which showed posttraumatic right frontal subarachnoid hemorrhage, small right frontal acute subdural hematoma representing contrecoup injury, left occipital diastasis, scalp hematoma and slight subcutaneous air due to laceration. CT of the cervical spine showed advanced cervical spondylosis but no fracture or subluxation. Case was discussed with neurosurgery at [facility] who recommended transferring the  patient to [facility] for closer monitoring and the stepdown unit but no surgical intervention indicated at this time. They also recommended repeating CT head tomorrow morning and starting the patient on seizure prophylaxis. Patient will be seen by them once the patient arrives and will follow along. This was discussed by the ER physician with the neurosurgery provider on call. In the ER patient was given 5 mg of Norvasc, hydrochlorothiazide 25 mg and Cozaar 100 mg. IV Keppra was ordered.  All think patient reports off to me at this time is his lower back pain but per daughter patient is always complaining of lower back pain.   Review of Systems: As per HPI otherwise 10 point review of systems negative.   Past Medical History:  Diagnosis Date  . Arthritis   . Hypertension     Past Surgical History:  Procedure Laterality Date  . HERNIA REPAIR       reports that he has never smoked. He has never used smokeless tobacco. He reports that he does not drink alcohol or use drugs.  No Known Allergies  Family History  Problem Relation Age of Onset  . CAD Father        died of MI in his 71's    Acceptable: Family history reviewed and not pertinent (If you reviewed it)  Prior to Admission medications   Medication Sig Start Date End Date Taking? Authorizing Provider  acetaminophen (TYLENOL) 500 MG tablet Take 500-1,000 mg by mouth every 6 (six) hours as needed for moderate pain.   Yes [provider]  aspirin EC  81 MG tablet Take 81 mg by mouth daily.   Yes [provider]  Cholecalciferol (VITAMIN D-3) 5000 units TABS Take 5,000 Units by mouth daily.   Yes [provider]  donepezil (ARICEPT) 10 MG tablet Take 1 tablet (10 mg total) by mouth at bedtime. 09/27/16  Yes Vann, Jessica U, DO  GARLIC PO Take 1 capsule by mouth daily.   Yes [provider]  Lidocaine-Menthol (ICY HOT LIDOCAINE PLUS MENTHOL EX) Apply 1 application topically 5 (five) times daily as  needed (back pain).   Yes [provider]  LORazepam (ATIVAN) 0.5 MG tablet Take 0.5 mg by mouth daily as needed for anxiety.   Yes [provider]  losartan-hydrochlorothiazide (HYZAAR) 100-25 MG tablet Take 1 tablet by mouth daily. 08/20/15  Yes [provider]  Menthol, Topical Analgesic, (BIOFREEZE EX) Apply 1 application topically 5 (five) times daily as needed (back pain).   Yes [provider]  Multiple Vitamin (MULTIVITAMIN WITH MINERALS) TABS tablet Take 1 tablet by mouth daily.   Yes [provider]  Omega-3 Fatty Acids (FISH OIL) 1000 MG CAPS Take 1,000 mg by mouth daily.   Yes [provider]  Sennosides (SENNA LAXATIVE) 25 MG TABS Take 25 mg by mouth every other day.   Yes [provider]    Physical Exam: Vitals:   05/30/17 1152 05/30/17 1230 05/30/17 1550 05/30/17 1701  BP: (!) 181/84 (!) 185/68 (!) 196/93 (!) 172/81  Pulse: 84 75 66 80  Resp: (!) 23 (!) Temp: 97.9 F (36.6 C)     TempSrc: Oral     SpO2: 95% 98% 97% 98%  Weight:      Height:          Constitutional: NAD, calm, comfortable  Vitals:   05/30/17 1152 05/30/17 1230 05/30/17 1550 05/30/17 1701  BP: (!) 181/84 (!) 185/68 (!) 196/93 (!) 172/81  Pulse: 84 75 66 80  Resp: (!) 23 (!) Temp: 97.9 F (36.6 C)     TempSrc: Oral     SpO2: 95% 98% 97% 98%  Weight:      Height:       Eyes: PERRL, lids and conjunctivae normal ENMT: Mucous membranes are moist. Posterior pharynx clear of any exudate or lesions.Normal dentition.  Grossly patient does not appear to have any signs of active bleeding superficially but does have hematoma of the occipital scalp. Neck: normal, supple, no masses, no thyromegaly Respiratory: clear to auscultation bilaterally, no wheezing, no crackles. Normal respiratory effort. No accessory muscle use.  Cardiovascular: Regular rate and rhythm, no murmurs / rubs / gallops. No extremity edema. 2+ pedal  pulses. No carotid bruits.  Abdomen: no tenderness, no masses palpated. No hepatosplenomegaly. Bowel sounds positive.  Musculoskeletal: no clubbing / cyanosis. No joint deformity upper and lower extremities. Good ROM, no contractures. Normal muscle tone.  Skin: no rashes, lesions, ulcers. No induration Neurologic: CN 2-12 grossly intact. Sensation intact, DTR normal. Strength 5/5 in all 4.  Psychiatric: Normal judgment and insight. Alert and oriented x 2. Normal mood.     Labs on Admission: I have personally reviewed following labs and imaging studies  CBC:  Recent Labs Lab 05/30/17 1340  WBC 6.1  NEUTROABS 5.3  HGB 13.9  HCT 42.1  MCV 94.6  PLT 171   Basic Metabolic Panel:  Recent Labs Lab 05/30/17 1340  NA 136  K 3.6  CL 99*  CO2 25  GLUCOSE 133*  BUN 21*  CREATININE 1.20  CALCIUM 9.5   GFR: Estimated Creatinine Clearance: 42.7 mL/min (by C-G formula based on SCr of 1.2 mg/dL). Liver Function Tests:  Recent Labs Lab 05/30/17 1340  AST 29  ALT 22  ALKPHOS 77  BILITOT 0.7  PROT 7.5  ALBUMIN 4.4   No results for input(s): LIPASE, AMYLASE in the last 168 hours. No results for input(s): AMMONIA in the last 168 hours. Coagulation Profile: No results for input(s): INR, PROTIME in the last 168 hours. Cardiac Enzymes: No results for input(s): CKTOTAL, CKMB, CKMBINDEX, TROPONINI in the last 168 hours. BNP (last 3 results) No results for input(s): PROBNP in the last 8760 hours. HbA1C: No results for input(s): HGBA1C in the last 72 hours. CBG: No results for input(s): GLUCAP in the last 168 hours. Lipid Profile: No results for input(s): CHOL, HDL, LDLCALC, TRIG, CHOLHDL, LDLDIRECT in the last 72 hours. Thyroid Function Tests: No results for input(s): TSH, T4TOTAL, FREET4, T3FREE, THYROIDAB in the last 72 hours. Anemia Panel: No results for input(s): VITAMINB12, FOLATE, FERRITIN, TIBC, IRON, RETICCTPCT in the last 72 hours. Urine analysis:    Component  Value Date/Time   COLORURINE yellow 10/22/2008 0859   APPEARANCEUR Clear 10/22/2008 0859   LABSPEC 1.015 10/22/2008 0859   PHURINE 6.0 10/22/2008 0859   GLUCOSEU NEGATIVE 05/08/2008 1755   HGBUR small 10/22/2008 0859   BILIRUBINUR negative 10/22/2008 0859   KETONESUR NEGATIVE 05/08/2008 1755   PROTEINUR NEGATIVE 05/08/2008 1755   UROBILINOGEN 0.2 10/22/2008 0859   NITRITE negative 10/22/2008 0859   LEUKOCYTESUR  05/08/2008 1755    NEGATIVE MICROSCOPIC NOT DONE ON URINES WITH NEGATIVE PROTEIN, BLOOD, LEUKOCYTES, NITRITE, OR GLUCOSE <1000 mg/dL.   Sepsis Labs: !!!!!!!!!!!!!!!!!!!!!!!!!!!!!!!!!!!!!!!!!!!! (procalcitonin:4,lacticidven:4) )No results found for this or any previous visit (from the past 240 hour(s)).   Radiological Exams on Admission: Dg Shoulder 1 View Left  Result Date: 05/30/2017 CLINICAL DATA:  Status post fall striking the left shoulder. EXAM: LEFT SHOULDER - 1 VIEW COMPARISON:  Chest x-ray dated September 25, 2016 which included a portion of the left shoulder. FINDINGS: A single AP of the shoulder has the humerus rotated internally. The bones are subjectively adequately mineralized. There is no definite acute fracture or dislocation. IMPRESSION: No acute bony abnormality of the left shoulder is observed on this single view. A full shoulder series is recommended. Electronically Signed   By: David  [place] M.D.   On: 05/30/2017 13:15   Ct Head Wo Contrast  Result Date: 05/30/2017 CLINICAL DATA:  Patient found after falling on street.  Dizzy. EXAM: CT HEAD WITHOUT CONTRAST TECHNIQUE: Contiguous axial images were obtained from the base of the skull through the vertex without intravenous contrast. COMPARISON:  CT head 11/08/2010. FINDINGS: Brain: There is evidence for posttraumatic RIGHT frontal subarachnoid hemorrhage over the RIGHT anterior and inferior frontal lobe. Parenchymal contusion difficult to see but not excluded. There is a small acute subdural hematoma  anteriorly, up to 4 mm thick. There is a slight interhemispheric component. The hemorrhage represents a contrecoup injury from a fall onto the LEFT occiput. There is diastasis of the LEFT lambdoid suture. There is no occipital bone fracture. There is a LEFT temporal occipital scalp hematoma with laceration. There are a few tiny bubbles of air in the epidural space as seen on image 12 series 2. There is some fluid/blood in the LEFT mastoid air cells. Vascular: No hyperdense vessel or unexpected calcification. Skull: LEFT mastoid suture diastasis.  No frank skull fracture. Sinuses/Orbits: No layering  sinus fluid.  No visible orbital injury. Other: No evidence for hemotympanum.  LEFT mastoid fluid/ blood. IMPRESSION: Posttraumatic RIGHT frontal subarachnoid hemorrhage, and small RIGHT frontal acute subdural hematoma, up to 4 mm thick representing a contrecoup injury. Fall onto the LEFT occiput resulting in diastasis of the LEFT lambdoid suture, minimal pneumocephalus, LEFT-sided scalp hematoma, and subcutaneous air due to laceration and/or disruption of the mastoid air cells. Critical Value/emergent results were called by telephone at the time of interpretation on 05/30/2017 at 1:25 pm to Dr. Doug Sou , who verbally acknowledged these results. Electronically Signed   By: Elsie Stain M.D.   On: 05/30/2017 13:33   Ct Cervical Spine Wo Contrast  Result Date: 05/30/2017 CLINICAL DATA:  Larey Seat.  Possible loss of consciousness. EXAM: CT CERVICAL SPINE WITHOUT CONTRAST TECHNIQUE: Multidetector CT imaging of the cervical spine was performed without intravenous contrast. Multiplanar CT image reconstructions were also generated. COMPARISON:  CT head reported separately. FINDINGS: Alignment: Straightening of the normal cervical lordosis. No traumatic subluxation. Skull base and vertebrae: No fracture or worrisome osseous lesion. Soft tissues and spinal canal: No prevertebral fluid or swelling. No visible canal hematoma.  Disc levels: Severe disc space narrowing from C2-3 through C7-T1, with varying degrees of osseous spurring. Upper chest: Severe emphysematous change. No pneumothorax or visible upper rib fracture. Other: Nuchal ligament calcification.  Carotid atherosclerosis. IMPRESSION: Advanced cervical spondylosis. No cervical spine fracture or traumatic subluxation. Electronically Signed   By: Elsie Stain M.D.   On: 05/30/2017 15:02    EKG: Independently reviewed.   Assessment/Plan Active Problems:   Subarachnoid hemorrhage (HCC)   Fall-mechanical versus syncope Subarachnoid hemorrhage and subdural hematoma -Admit to [facility] to stepdown unit for closer monitoring -Neurosurgery consulted who will follow along the patient neck sign-CT of the head today shows subarachnoid hemorrhage and subdural hematoma, repeat CT head tomorrow -Neurochecks, seizure prophylaxis-Keppra 500 mg IV twice daily -Closely manage blood pressure-Norvasc, hydrochlorothiazide and Cozaar given in the ER. Will order IV hydralazine 10 mg every 4 hours as needed. If this does not adequately control blood pressure he'll need to be on a drip -Trend cardiac enzymes, Check TSH, echocardiogram from earlier this year reviewed -Supportive care, fall precaution, seizure precaution -PT/OT eval when able -Carotid ultrasound? For evaluation of syncope. Check orthostatics once his XRs are noted to be ok.  Low back Pain -chronic? But due to the fall I will go ahead and order Lumbar and Pelvic XR.   HTN -cont home medications.   Dementia -will need extra help at home, should try and make arrangements to help him out once he goes home.   DVT prophylaxis: SCDs Code Status: DNR Family Communication: Daughter at the bedside.  Disposition Plan: transfer to [facility] Consults called: Neurosurgery Admission status: Stepdown admit to [facility].    Latonda Larrivee Joline Maxcy MD [facility]   If 7PM-7AM, please contact  night-coverage [email or web address] Password [email or web address]  05/30/2017, 5:15 PM

## 2017-05-30 NOTE — Progress Notes (Signed)
Patient with small traumatic SAH and SDH after unwitnessed fall while on morning walk Does have a history of syncopal events. Discussed case with attending Dr Lisbeth Renshaw who has also reviewed the imaging. There is obviously no indication for NS intervention for this small hematoma. He is at low risk for worsening because he is not on anti-coag. In fact, based on age, he really is a poor surgical candidate and we would rec not proceeding with any type of intervention.  Per Dr Felix Pacini (ED physician), patient is being transferred to [facility] at request of family. Because of this unwitnessed possible syncopal episode & dementia, he should be admitted under hospitalist service in step down unit. Monitor neuro exam q 1-2 hours.  Can repeat scan tomorrow am. Keppra  BID x7days for seizure prophylaxis. Will see patient upon arrival to [facility] based on arrival time. No need to page upon arrival.

## 2017-05-30 NOTE — ED Triage Notes (Signed)
Pt was found after falling on the street. Pt stated he got dizzy and fell on his left shoulder. Pt is alert and oriented at this time. A neighbor thinks he may have gone unconscious when he fell. Pt was able to walk to the stretcher. Was also ambulatory on scene.   VSS, but BP elevated in ambulance   214/90

## 2017-05-30 NOTE — ED Notes (Addendum)
Pt to CT and xray. Will draw blood and give meds upon return

## 2017-05-30 NOTE — ED Notes (Signed)
MD at bedside. 

## 2017-05-30 NOTE — ED Notes (Signed)
Have applied condom cath

## 2017-05-30 NOTE — ED Provider Notes (Addendum)
[facility]Y DEPT Provider Note   CSN: [id number] Arrival date & time: 05/30/17  1141     History   Chief Complaint Chief Complaint  Patient presents with  . Fall   Level V caveat patient with memory impairment. History is obtained from patient's daughter who accompanies him andfrom patient HPI [name] is a 83 y.o. male.  HPI Patient was going for his morning walk this morning and was found on the ground by bystanders. He does not think he lost consciousness. He complains of mild dizziness he denies feeling of room spinning he cannot characterize dizziness well. He complains of mild posterior left shoulder pain since the fall, worse with moving his shoulder. No other complaint. States he's presently hungry. He did not take his morning medications. No other associated symptoms. No treatment prior to coming here he looks at his baseline Past Medical History:  Diagnosis Date  . Arthritis   . Hypertension     Patient Active Problem List   Diagnosis Date Noted  . LBBB (left bundle branch block) 09/25/2016  . Syncope and collapse 09/25/2016  . Syncope 09/25/2016  . 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 BACK PAIN, LUMBAR, WITH RADICULOPATHY 03/27/2008  . MEMORY LOSS 03/27/2008  . ALLERGIC RHINITIS 12/26/2007  . ERECTILE DYSFUNCTION 08/31/2007  . CONSTIPATION 03/22/2007  . ALLERGIC RHINITIS, SEASONAL 01/04/2007  . DEGENERATIVE JOINT DISEASE 12/27/2006  . BENIGN PROSTATIC HYPERTROPHY, WITH OBSTRUCTION 11/14/2006  . HYPERLIPIDEMIA 07/24/2006  . Essential hypertension 07/24/2006  . VENTRICULAR HYPERTROPHY, LEFT 07/24/2006  . RAYNAUD'S DISEASE 07/24/2006  . VENOUS INSUFFICIENCY 07/24/2006  . COLONIC POLYPS, HX OF 07/24/2006    Past Surgical History:  Procedure Laterality Date  . HERNIA REPAIR         Home Medications    Prior to Admission medications   Medication Sig Start Date End Date Taking? Authorizing Provider  acetaminophen (TYLENOL) 500 MG tablet Take  500-1,000 mg by mouth every 6 (six) hours as needed for moderate pain.    [provider]  aspirin EC 81 MG tablet Take 81 mg by mouth daily.    [provider]  Cholecalciferol (VITAMIN D-3) 5000 units TABS Take 5,000 Units by mouth daily.    [provider]  donepezil (ARICEPT) 10 MG tablet Take 1 tablet (10 mg total) by mouth at bedtime. 09/27/16   Joseph Art, DO  GARLIC PO Take 1 capsule by mouth daily.    [provider]  Lidocaine-Menthol (ICY HOT LIDOCAINE PLUS MENTHOL EX) Apply 1 application topically 5 (five) times daily as needed (back pain).    [provider]  LORazepam (ATIVAN) 0.5 MG tablet Take 0.5 mg by mouth daily as needed for anxiety.    [provider]  losartan-hydrochlorothiazide (HYZAAR) 100-25 MG tablet Take 1 tablet by mouth daily. 08/20/15   [provider]  Menthol, Topical Analgesic, (BIOFREEZE EX) Apply 1 application topically 5 (five) times daily as needed (back pain).    [provider]  Multiple Vitamin (MULTIVITAMIN WITH MINERALS) TABS tablet Take 1 tablet by mouth daily.    [provider]  naproxen sodium (ALEVE) 220 MG tablet Take 220 mg by mouth 2 (two) times daily as needed (pain).    [provider]  Omega-3 Fatty Acids (FISH OIL) 1000 MG CAPS Take 1,000 mg by mouth daily.    [provider]  Sennosides (SENNA LAXATIVE) 25 MG TABS Take 25 mg by mouth every other day.    [provider]  Family History Family History  Problem Relation Age of Onset  . CAD Father        died of MI in his 82's    Social History Social History  Substance Use Topics  . Smoking status: Never Smoker  . Smokeless tobacco: Never Used  . Alcohol use No     Allergies   Patient has no known allergies.   Review of Systems Review of Systems  Unable to perform ROS: Other  Musculoskeletal: Positive for arthralgias and gait problem.       Left shoulder pain.  Walks with cane  Neurological: Positive for dizziness and headaches.       Mild headache since fall today  unable to perform complete review of systems--memory loss  Physical Exam Updated Vital Signs BP (!) 181/84 (BP Location: Left Arm)   Pulse 84   Temp 97.9 F (36.6 C) (Oral)   Resp (!) 23   Ht  (1.803 m)   Wt 79.8 kg (176 lb)   SpO2 95%   BMI 24.55 kg/m   Physical Exam  Constitutional: He appears well-developed and well-nourished. No distress.  HENT:  Approximately 3 cmdiameter hematoma at occiput otherwise normocephalic atraumatic  Eyes: Pupils are equal, round, and reactive to light. Conjunctivae are normal.  Neck: Neck supple. No tracheal deviation present. No thyromegaly present.  Cardiovascular: Normal rate, regular rhythm and normal heart sounds.   No murmur heard. Pulmonary/Chest: Effort normal and breath sounds normal.  Abdominal: Soft. Bowel sounds are normal. He exhibits no distension. There is no tenderness.  Musculoskeletal: Normal range of motion. He exhibits no edema or tenderness.  Left shoulder without deformity or swelling or tenderness, minimally tender overlying scapula full range of motion radial pulse 2+. All other extremity is a contusion abrasion or tenderness neurovascular intact  Neurological: He is alert. Coordination normal.  Walks with cane without difficulty  Skin: Skin is warm and dry. No rash noted.  Psychiatric: He has a normal mood and affect.  Nursing note and vitals reviewed.    ED Treatments / Results  Labs (all labs ordered are listed, but only abnormal results are displayed) Labs Reviewed  CBC WITH DIFFERENTIAL/PLATELET  COMPREHENSIVE METABOLIC PANEL    EKG  EKG Interpretation None     ED ECG REPORT   Date: 05/30/2017  Rate: 60  Rhythm: normal sinus rhythm  QRS Axis: normal  Intervals: PR prolonged  ST/T Wave abnormalities: nonspecific T wave changes  Conduction Disutrbances:left bundle branch block  Narrative  Interpretation:   Old EKG Reviewed: unchanged nchanged from 09/25/2016 I have personally reviewed the EKG tracing and agree with the computerized printout as noted.  Radiology No results found.  Procedures .Critical Care Performed by: Doug Sou Authorized by: Doug Sou   Critical care provider statement:    Critical care time (minutes):  60   Critical care start time:  05/30/2017 1:25 PM   Critical care end time:  05/30/2017 2:25 PM   Critical care was necessary to treat or prevent imminent or life-threatening deterioration of the following conditions:  Trauma   Critical care was time spent personally by me on the following activities:  Development of treatment plan with patient or surrogate, discussions with consultants, examination of patient, ordering and review of laboratory studies and re-evaluation of patient's condition   I assumed direction of critical care for this patient from another provider in my specialty: no       (including critical care time)  Medications Ordered in  ED Medications  amLODipine (NORVASC) tablet 5 mg (not administered)  losartan (COZAAR) tablet 100 mg (not administered)  hydrochlorothiazide (HYDRODIURIL) tablet 25 mg (not administered)   X-rays viewed by me. Results for orders placed or performed during the hospital encounter of 05/30/17  CBC with Differential/Platelet  Result Value Ref Range   WBC 6.1 4.0 - 10.5 K/uL   RBC 4.45 4.22 - 5.81 MIL/uL   Hemoglobin 13.9 13.0 - 17.0 g/dL   HCT 16.1 09.6 - 04.5 %   MCV 94.6 78.0 - 100.0 fL   MCH 31.2 26.0 - 34.0 pg   MCHC 33.0 30.0 - 36.0 g/dL   RDW 40.9 81.1 - 91.4 %   Platelets 171 150 - 400 K/uL   Neutrophils Relative % 87 %   Neutro Abs 5.3 1.7 - 7.7 K/uL   Lymphocytes Relative 8 %   Lymphs Abs 0.5 (L) 0.7 - 4.0 K/uL   Monocytes Relative 4 %   Monocytes Absolute 0.2 0.1 - 1.0 K/uL   Eosinophils Relative 1 %   Eosinophils Absolute 0.0 0.0 - 0.7 K/uL   Basophils Relative 0 %    Basophils Absolute 0.0 0.0 - 0.1 K/uL  Comprehensive metabolic panel  Result Value Ref Range   Sodium 136 135 - 145 mmol/L   Potassium 3.6 3.5 - 5.1 mmol/L   Chloride 99 (L) 101 - 111 mmol/L   CO2 25 22 - 32 mmol/L   Glucose, Bld 133 (H) 65 - 99 mg/dL   BUN 21 (H) 6 - 20 mg/dL   Creatinine, Ser 7.82 0.61 - 1.24 mg/dL   Calcium 9.5 8.9 - 95.6 mg/dL   Total Protein 7.5 6.5 - 8.1 g/dL   Albumin 4.4 3.5 - 5.0 g/dL   AST 29 15 - 41 U/L   ALT 22 17 - 63 U/L   Alkaline Phosphatase 77 38 - 126 U/L   Total Bilirubin 0.7 0.3 - 1.2 mg/dL   GFR calc non Af Amer 51 (L) >60 mL/min   GFR calc Af Amer 59 (L) >60 mL/min   Anion gap 12 5 - 15   Dg Shoulder 1 View Left  Result Date: 05/30/2017 CLINICAL DATA:  Status post fall striking the left shoulder. EXAM: LEFT SHOULDER - 1 VIEW COMPARISON:  Chest x-ray dated September 25, 2016 which included a portion of the left shoulder. FINDINGS: A single AP of the shoulder has the humerus rotated internally. The bones are subjectively adequately mineralized. There is no definite acute fracture or dislocation. IMPRESSION: No acute bony abnormality of the left shoulder is observed on this single view. A full shoulder series is recommended. Electronically Signed   By: David  [place] M.D.   On: 05/30/2017 13:15   Ct Head Wo Contrast  Result Date: 05/30/2017 CLINICAL DATA:  Patient found after falling on street.  Dizzy. EXAM: CT HEAD WITHOUT CONTRAST TECHNIQUE: Contiguous axial images were obtained from the base of the skull through the vertex without intravenous contrast. COMPARISON:  CT head 11/08/2010. FINDINGS: Brain: There is evidence for posttraumatic RIGHT frontal subarachnoid hemorrhage over the RIGHT anterior and inferior frontal lobe. Parenchymal contusion difficult to see but not excluded. There is a small acute subdural hematoma anteriorly, up to 4 mm thick. There is a slight interhemispheric component. The hemorrhage represents a contrecoup injury from a fall  onto the LEFT occiput. There is diastasis of the LEFT lambdoid suture. There is no occipital bone fracture. There is a LEFT temporal occipital scalp hematoma with laceration. There are  a few tiny bubbles of air in the epidural space as seen on image 12 series 2. There is some fluid/blood in the LEFT mastoid air cells. Vascular: No hyperdense vessel or unexpected calcification. Skull: LEFT mastoid suture diastasis.  No frank skull fracture. Sinuses/Orbits: No layering sinus fluid.  No visible orbital injury. Other: No evidence for hemotympanum.  LEFT mastoid fluid/ blood. IMPRESSION: Posttraumatic RIGHT frontal subarachnoid hemorrhage, and small RIGHT frontal acute subdural hematoma, up to 4 mm thick representing a contrecoup injury. Fall onto the LEFT occiput resulting in diastasis of the LEFT lambdoid suture, minimal pneumocephalus, LEFT-sided scalp hematoma, and subcutaneous air due to laceration and/or disruption of the mastoid air cells. Critical Value/emergent results were called by telephone at the time of interpretation on 05/30/2017 at 1:25 pm to Dr. Doug Sou , who verbally acknowledged these results. Electronically Signed   By: Elsie Stain M.D.   On: 05/30/2017 13:33   Ct Cervical Spine Wo Contrast  Result Date: 05/30/2017 CLINICAL DATA:  Larey Seat.  Possible loss of consciousness. EXAM: CT CERVICAL SPINE WITHOUT CONTRAST TECHNIQUE: Multidetector CT imaging of the cervical spine was performed without intravenous contrast. Multiplanar CT image reconstructions were also generated. COMPARISON:  CT head reported separately. FINDINGS: Alignment: Straightening of the normal cervical lordosis. No traumatic subluxation. Skull base and vertebrae: No fracture or worrisome osseous lesion. Soft tissues and spinal canal: No prevertebral fluid or swelling. No visible canal hematoma. Disc levels: Severe disc space narrowing from C2-3 through C7-T1, with varying degrees of osseous spurring. Upper chest: Severe  emphysematous change. No pneumothorax or visible upper rib fracture. Other: Nuchal ligament calcification.  Carotid atherosclerosis. IMPRESSION: Advanced cervical spondylosis. No cervical spine fracture or traumatic subluxation. Electronically Signed   By: Elsie Stain M.D.   On: 05/30/2017 15:02    I've consulted neurosurgery in [place] spoke with Cindra Presume, PA on-call for Dr.Nundkumar. After lengthy discussion with patient's daughter who contacted other family members patient will be transferred to [facility] for further evaluation and observation. IV Keppra initiated. He will be admittedunder hospitalist service for transfer to stepdown unit. I've consulted hospitalist who will arrange for transfer and admission  Initial Impression / Assessment and Plan / ED Course  I have reviewed the triage vital signs and the nursing notes.  Pertinent labs & imaging results that were available during my care of the patient were reviewed by me and considered in my medical decision making (see chart for details).     3:55 PMpatient resting comfortably. Alert and awakeComplains of mild left shoulder pain. No other complaint  Final Clinical Impressions(s) / ED Diagnoses  Diagnosis #1 closed head injury #2 subdural hematoma 3 traumatic subarachnoid hemorrhage #4 pneumocephalus #5 contusion to left shoulder #6 fall Final diagnoses:  None    New Prescriptions New Prescriptions   No medications on file     Doug Sou, MD 05/30/17 1722    Doug Sou, MD 05/30/17 2003 8 PM patient is alert follows simple commands. Denies complaint. Stable for transfer to [facility]. Accepting physician Dr. Marrion Coy, Ari Bernabei, MD 05/30/17 2004

## 2017-05-31 ENCOUNTER — Inpatient Hospital Stay (HOSPITAL_COMMUNITY): Payer: Medicare HMO

## 2017-05-31 DIAGNOSIS — I609 Nontraumatic subarachnoid hemorrhage, unspecified: Secondary | ICD-10-CM

## 2017-05-31 DIAGNOSIS — S065X9A Traumatic subdural hemorrhage with loss of consciousness of unspecified duration, initial encounter: Secondary | ICD-10-CM

## 2017-05-31 DIAGNOSIS — S0990XA Unspecified injury of head, initial encounter: Secondary | ICD-10-CM

## 2017-05-31 DIAGNOSIS — I10 Essential (primary) hypertension: Secondary | ICD-10-CM

## 2017-05-31 LAB — BASIC METABOLIC PANEL
ANION GAP: 12 (ref 5–15)
Anion gap: 10 (ref 5–15)
BUN: 16 mg/dL (ref 6–20)
BUN: 16 mg/dL (ref 6–20)
CALCIUM: 9.1 mg/dL (ref 8.9–10.3)
CHLORIDE: 100 mmol/L — AB (ref 101–111)
CO2: 24 mmol/L (ref 22–32)
CO2: 25 mmol/L (ref 22–32)
CREATININE: 1.16 mg/dL (ref 0.61–1.24)
CREATININE: 1.23 mg/dL (ref 0.61–1.24)
Calcium: 8.8 mg/dL — ABNORMAL LOW (ref 8.9–10.3)
Chloride: 99 mmol/L — ABNORMAL LOW (ref 101–111)
GFR calc non Af Amer: 49 mL/min — ABNORMAL LOW (ref >60)
GFR, EST AFRICAN AMERICAN: 57 mL/min — AB (ref >60)
GFR, EST NON AFRICAN AMERICAN: 53 mL/min — AB (ref >60)
Glucose, Bld: 125 mg/dL — ABNORMAL HIGH (ref 65–99)
Glucose, Bld: 116 mg/dL — ABNORMAL HIGH (ref 65–99)
Potassium: 3.4 mmol/L — ABNORMAL LOW (ref 3.5–5.1)
Potassium: 3.4 mmol/L — ABNORMAL LOW (ref 3.5–5.1)
SODIUM: 135 mmol/L (ref 135–145)
SODIUM: 135 mmol/L (ref 135–145)

## 2017-05-31 LAB — URINALYSIS, ROUTINE W REFLEX MICROSCOPIC
Bacteria, UA: NONE SEEN
Bilirubin Urine: NEGATIVE
GLUCOSE, UA: NEGATIVE mg/dL
Ketones, ur: 5 mg/dL — AB
LEUKOCYTES UA: NEGATIVE
NITRITE: NEGATIVE
PH: 5 (ref 5.0–8.0)
PROTEIN: 100 mg/dL — AB
SPECIFIC GRAVITY, URINE: 1.018 (ref 1.005–1.030)
Squamous Epithelial / LPF: NONE SEEN

## 2017-05-31 LAB — RAPID URINE DRUG SCREEN, HOSP PERFORMED
Amphetamines: NOT DETECTED
Barbiturates: NOT DETECTED
Benzodiazepines: NOT DETECTED
Cocaine: NOT DETECTED
OPIATES: NOT DETECTED
Tetrahydrocannabinol: NOT DETECTED

## 2017-05-31 LAB — CBC
HEMATOCRIT: 35.7 % — AB (ref 39.0–52.0)
HEMATOCRIT: 37 % — AB (ref 39.0–52.0)
HEMOGLOBIN: 12.3 g/dL — AB (ref 13.0–17.0)
Hemoglobin: 12.1 g/dL — ABNORMAL LOW (ref 13.0–17.0)
MCH: 31.1 pg (ref 26.0–34.0)
MCH: 30.5 pg (ref 26.0–34.0)
MCHC: 33.9 g/dL (ref 30.0–36.0)
MCHC: 33.2 g/dL (ref 30.0–36.0)
MCV: 91.8 fL (ref 78.0–100.0)
MCV: 91.8 fL (ref 78.0–100.0)
Platelets: 143 10*3/uL — ABNORMAL LOW (ref 150–400)
Platelets: 150 10*3/uL (ref 150–400)
RBC: 3.89 MIL/uL — ABNORMAL LOW (ref 4.22–5.81)
RBC: 4.03 MIL/uL — AB (ref 4.22–5.81)
RDW: 13 % (ref 11.5–15.5)
RDW: 12.6 % (ref 11.5–15.5)
WBC: 8 10*3/uL (ref 4.0–10.5)
WBC: 8.1 10*3/uL (ref 4.0–10.5)

## 2017-05-31 LAB — TROPONIN I
TROPONIN I: 0.35 ng/mL — AB (ref <0.03)
TROPONIN I: 0.42 ng/mL — AB (ref <0.03)

## 2017-05-31 LAB — TSH: TSH: 0.433 u[IU]/mL (ref 0.350–4.500)

## 2017-05-31 MED ORDER — SODIUM CHLORIDE 0.9 % IV SOLN
500.0000 mg | Freq: Two times a day (BID) | INTRAVENOUS | Status: DC
  Administered 2017-05-31: 500 mg via INTRAVENOUS
  Filled 2017-05-31 (×3): qty 5

## 2017-05-31 MED ORDER — LEVETIRACETAM 500 MG PO TABS
500.0000 mg | ORAL_TABLET | Freq: Two times a day (BID) | ORAL | Status: DC
  Administered 2017-05-31 (×2): 500 mg via ORAL
  Filled 2017-05-31 (×3): qty 1

## 2017-05-31 NOTE — Progress Notes (Signed)
Called report to [facility] spoke to [facility] aware of pending transfer.

## 2017-05-31 NOTE — Progress Notes (Signed)
Pt transferred per bed with all belongings to [address] bed 15- daughter at bedside next staff made aware that pt is high fall risk - has low bed for pt. bed alarm placed on - took chair alarm pad along with pt for use on next unit

## 2017-05-31 NOTE — Evaluation (Signed)
Speech Language Pathology Evaluation Patient Details Name: Mattix Imhof MRN: [id number] DOB: 06-Jan-1924 Today's Date: 05/31/2017 Time: 1017-1030 SLP Time Calculation (min) (ACUTE ONLY): 13 min  Problem List:  Patient Active Problem List   Diagnosis Date Noted  . Subarachnoid hemorrhage (HCC) 05/30/2017  . LBBB (left bundle branch block) 09/25/2016  . Syncope and collapse 09/25/2016  . Syncope 09/25/2016  . 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 BACK PAIN, LUMBAR, WITH RADICULOPATHY 03/27/2008  . MEMORY LOSS 03/27/2008  . ALLERGIC RHINITIS 12/26/2007  . ERECTILE DYSFUNCTION 08/31/2007  . CONSTIPATION 03/22/2007  . ALLERGIC RHINITIS, SEASONAL 01/04/2007  . DEGENERATIVE JOINT DISEASE 12/27/2006  . BENIGN PROSTATIC HYPERTROPHY, WITH OBSTRUCTION 11/14/2006  . HYPERLIPIDEMIA 07/24/2006  . Essential hypertension 07/24/2006  . VENTRICULAR HYPERTROPHY, LEFT 07/24/2006  . RAYNAUD'S DISEASE 07/24/2006  . VENOUS INSUFFICIENCY 07/24/2006  . COLONIC POLYPS, HX OF 07/24/2006   Past Medical History:  Past Medical History:  Diagnosis Date  . Arthritis   . Hypertension    Past Surgical History:  Past Surgical History:  Procedure Laterality Date  . HERNIA REPAIR     HPI:  Pt is a 83 y.o.malewith medical history significant ofhypertension and dementia who was brought to the ER by the family for evaluation s/p fall. CT Head showed posttraumatic right frontal subarachnoid hemorrhage, small right frontal acute subdural hematoma representing contrecoup injury, left occipital diastasis, scalp hematoma and slight subcutaneous air due to laceration. CT of the cervical spine showed advanced cervical spondylosis. Pt was transferred to [facility] for closer monitoring but with no surgical intervention indicated at this time.    Assessment / Plan / Recommendation Clinical Impression  Pt is oriented to person only and needs Max cues for simple problem solving to use environmental cues for  reorientation to location, time, and situation. His recall, even in the short-term, is limited, and he provides minimal history about baseline function. Pt's sustained attention is reduced which mildly impacts his ability to follow one-step commands. No family is present to determine how this compares to his baseline, but per chart review/discussion with RN, it seems as though he was functioning independently enough to go on his daily walks without assistance. At this time, I think he would require 24/7 supervision. Will continue to follow to maximize cognitive function and increase safety.     SLP Assessment  SLP Recommendation/Assessment: Patient needs continued Speech Lanaguage Pathology Services SLP Visit Diagnosis: Cognitive communication deficit (R41.841)    Follow Up Recommendations   (tba)    Frequency and Duration min 2x/week  2 weeks      SLP Evaluation Cognition  Overall Cognitive Status: No family/caregiver present to determine baseline cognitive functioning Arousal/Alertness: Awake/alert Orientation Level: Oriented to person;Disoriented to place;Disoriented to time;Disoriented to situation Attention: Sustained Sustained Attention: Impaired Sustained Attention Impairment: Functional basic;Verbal basic Memory: Impaired Memory Impairment: Decreased recall of new information Awareness: Impaired Awareness Impairment: Intellectual impairment;Emergent impairment;Anticipatory impairment Problem Solving: Impaired Problem Solving Impairment: Verbal basic Safety/Judgment: Impaired       Comprehension  Auditory Comprehension Overall Auditory Comprehension: Impaired Commands: Impaired One Step Basic Commands: 75-100% accurate    Expression Expression Primary Mode of Expression: Verbal Verbal Expression Overall Verbal Expression: Appears within functional limits for tasks assessed   Oral / Motor  Oral Motor/Sensory Function Overall Oral Motor/Sensory Function: Within  functional limits Motor Speech Overall Motor Speech: Appears within functional limits for tasks assessed   GO  Maxcine Ham 05/31/2017, 10:53 AM   Maxcine Ham, M.A. CCC-SLP [phone]

## 2017-05-31 NOTE — Progress Notes (Signed)
Occupational Therapy Evaluation Patient Details Name: [name] MRN: [id number] DOB: [birth year] Today's Date: 05/31/2017    History of Present Illness 83 y.o.malewith medical history significant ofhypertension, dementia admitted after a ground-level fall and found to have right frontal subarachnoid hemorrhage as well as small right frontal acute subdural hematoma.   Clinical Impression   PTA Pt modified independent in ADL and mobility with SPC. Pt set up for ADL (mod A for LB ADL) and mod A for mobility with HHA. Pt continues to have decreased cognition, unaware of where he was, struggles to follow one step directions, and very difficult to redirect. Pt will benefit from skilled OT in the acute setting to maximize safety and independence in ADL and mobility, and will require 24 hour supervision upon dc home. I would anticipate that cognition will improve once Pt is in familiar environment, and he is surrounded by more familiar faces. Next session to focus on tub safety education with family and determine if tub bench or shower chair would be more appropriate/safer.    Follow Up Recommendations  Supervision/Assistance - 24 hour    Equipment Recommendations  Tub/shower bench    Recommendations for Other Services       Precautions / Restrictions Precautions Precautions: Fall Restrictions Weight Bearing Restrictions: No      Mobility Bed Mobility Overal bed mobility: Needs Assistance Bed Mobility: Supine to Sit     Supine to sit: Min guard;Min assist     General bed mobility comments: min guard assist to come sit EOB; vc for sequencing  Transfers Overall transfer level: Needs assistance Equipment used: 2 person hand held assist;1 person hand held assist Transfers: Sit to/from Stand Sit to Stand: Mod assist         General transfer comment: initially seeking 2 HHA, able to perform with 1 HHA; mod A for balance     Balance Overall balance assessment: Needs  assistance Sitting-balance support: Single extremity supported;Feet supported Sitting balance-Leahy Scale: Fair     Standing balance support: Single extremity supported;Bilateral upper extremity supported;During functional activity Standing balance-Leahy Scale: Poor Standing balance comment: reliant on external support                           ADL either performed or assessed with clinical judgement   ADL Overall ADL's : Needs assistance/impaired Eating/Feeding: Supervision/ safety   Grooming: Wash/dry hands;Wash/dry face;Set up;Sitting Grooming Details (indicate cue type and reason): in recliner Upper Body Bathing: Supervision/ safety;Sitting   Lower Body Bathing: Supervison/ safety;Sitting/lateral leans   Upper Body Dressing : Set up;Sitting   Lower Body Dressing: Minimal assistance;Sit to/from stand   Toilet Transfer: Moderate assistance;Ambulation;+2 for safety/equipment Toilet Transfer Details (indicate cue type and reason): simulated through recliner transfer Toileting- Clothing Manipulation and Hygiene: Moderate assistance   Tub/ Shower Transfer: Moderate assistance   Functional mobility during ADLs: Moderate assistance (1 HHA, initially 2 HHA)       Vision   Additional Comments: able to see functionally during ambulation in hall and during ADL tasks, Pt reports his vision is at baseline     Perception     Praxis      Pertinent Vitals/Pain Pain Assessment: Faces Faces Pain Scale: Hurts a little bit Pain Location: lower back Pain Descriptors / Indicators: Aching Pain Intervention(s): Monitored during session;Repositioned     Hand Dominance     Extremity/Trunk Assessment Upper Extremity Assessment Upper Extremity Assessment: Overall WFL for tasks assessed   Lower  Extremity Assessment Lower Extremity Assessment: Defer to PT evaluation   Cervical / Trunk Assessment Cervical / Trunk Assessment: Kyphotic   Communication  Communication Communication: No difficulties   Cognition Arousal/Alertness: Awake/alert Behavior During Therapy: WFL for tasks assessed/performed Overall Cognitive Status: Impaired/Different from baseline Area of Impairment: Orientation;Attention;Memory;Following commands;Safety/judgement;Awareness;Problem solving                 Orientation Level: Disoriented to;Place;Time;Situation Current Attention Level: Sustained Memory: Decreased short-term memory Following Commands: Follows one step commands inconsistently Safety/Judgement: Decreased awareness of safety;Decreased awareness of deficits Awareness: Emergent Problem Solving: Slow processing;Decreased initiation;Difficulty sequencing;Requires verbal cues General Comments: Pt unreliable historian; Pt not aware of where he was despite telling him several times, Pt very difficult to redirect during session for following commands. Pt's daughter seemed unphased by his current level of cognitive orientation   General Comments       Exercises     Shoulder Instructions      Home Living Family/patient expects to be discharged to:: Private residence Living Arrangements: Spouse/significant other;Children Available Help at Discharge: Family;Available 24 hours/day Type of Home: House Home Access: Stairs to enter [employer] of Steps: 3   Home Layout: One level     Bathroom Shower/Tub: Tub/shower unit;Curtain   Firefighter: Standard     Home Equipment: Medical laboratory scientific officer - single point      Lives With: Spouse    Prior Functioning/Environment Level of Independence: Independent with assistive device(s)        Comments: ambulated with assist from [facility]        OT Problem List: Decreased activity tolerance;Impaired balance (sitting and/or standing);Decreased cognition;Decreased safety awareness;Decreased knowledge of use of DME or AE      OT Treatment/Interventions: Self-care/ADL training;DME and/or AE  instruction;Therapeutic activities;Patient/family education;Balance training    OT Goals(Current goals can be found in the care plan section) Acute Rehab OT Goals Patient Stated Goal: to get back home OT Goal Formulation: With patient Time For Goal Achievement: 06/14/17 Potential to Achieve Goals: Good ADL Goals Pt Will Perform Grooming: with supervision;standing Pt Will Perform Upper Body Bathing: with set-up;sitting Pt Will Perform Lower Body Bathing: with supervision;sitting/lateral leans Pt Will Transfer to Toilet: with supervision;ambulating (with SPC) Pt Will Perform Toileting - Clothing Manipulation and hygiene: with supervision;sit to/from stand Pt Will Perform Tub/Shower Transfer: Tub transfer;with min guard assist;with caregiver independent in assisting;tub bench;3 in 1 Additional ADL Goal #1: Pt will demonstrate bed mobility at supervision level as precursor for participation in ADL  OT Frequency: Min 2X/week   Barriers to D/C:            Co-evaluation PT/OT/SLP Co-Evaluation/Treatment: Yes Reason for Co-Treatment: Necessary to address cognition/behavior during functional activity;To address functional/ADL transfers;For patient/therapist safety   OT goals addressed during session: ADL's and self-care      AM-PAC PT "6 Clicks" Daily Activity     Outcome Measure Help from another person eating meals?: A Little Help from another person taking care of personal grooming?: A Little Help from another person toileting, which includes using toliet, bedpan, or urinal?: A Little Help from another person bathing (including washing, rinsing, drying)?: A Little Help from another person to put on and taking off regular upper body clothing?: A Little Help from another person to put on and taking off regular lower body clothing?: A Lot 6 Click Score: 17   End of Session Equipment Utilized During Treatment: Gait belt Nurse Communication: Mobility status  Activity Tolerance: Patient  tolerated treatment well Patient left: in  chair;with call bell/phone within reach;with chair alarm set;with family/visitor present  OT Visit Diagnosis: Unsteadiness on feet (R26.81);History of falling (Z91.81);Other symptoms and signs involving cognitive function                Time: 0981-1914 OT Time Calculation (min): 22 min Charges:  OT General Charges $OT Visit: 1 Visit OT Evaluation $OT Eval Moderate Complexity: 1 Mod G-Codes:    Sherryl Manges OTR/L [phone]  [name] 05/31/2017, 2:14 PM

## 2017-05-31 NOTE — Evaluation (Signed)
Clinical/Bedside Swallow Evaluation Patient Details  Name: [name] MRN: [id number] Date of Birth: July 09, 1924  Today's Date: 05/31/2017 Time: SLP Start Time (ACUTE ONLY): 1007 SLP Stop Time (ACUTE ONLY): 1017 SLP Time Calculation (min) (ACUTE ONLY): 10 min  Past Medical History:  Past Medical History:  Diagnosis Date  . Arthritis   . Hypertension    Past Surgical History:  Past Surgical History:  Procedure Laterality Date  . HERNIA REPAIR     HPI:  Pt is a 83 y.o.malewith medical history significant ofhypertension and dementia who was brought to the ER by the family for evaluation s/p fall. CT Head showed posttraumatic right frontal subarachnoid hemorrhage, small right frontal acute subdural hematoma representing contrecoup injury, left occipital diastasis, scalp hematoma and slight subcutaneous air due to laceration. CT of the cervical spine showed advanced cervical spondylosis. Pt was transferred to [facility] for closer monitoring but with no surgical intervention indicated at this time.    Assessment / Plan / Recommendation Clinical Impression  Pt has prolonged, incomplete mastication of soft solids, requiring Mod cues from SLP for second swallows and liquid washes, with the latter being the most effective at clearing his oral cavity. He has multiple swallows with thin liquids, but no overt s/s of aspiration are observed across consistencies. Recommend initiation of Dys 2 textures and thin liquids, with supervision to assist in reducing oral residue. Will f/u for tolerance and potential advancement pending improved awareness and/or use of dentures (pt denies using them at home, but he is also not a clear historian). SLP Visit Diagnosis: Dysphagia, oral phase (R13.11)    Aspiration Risk  Mild aspiration risk    Diet Recommendation Dysphagia 2 (Fine chop);Thin liquid   Liquid Administration via: Cup;Straw Medication Administration: Whole meds with puree Supervision: Patient  able to self feed;Full supervision/cueing for compensatory strategies Compensations: Slow rate;Small sips/bites;Follow solids with liquid Postural Changes: Seated upright at 90 degrees    Other  Recommendations Oral Care Recommendations: Oral care BID Other Recommendations: Have oral suction available   Follow up Recommendations  (tba)      Frequency and Duration min 2x/week  2 weeks       Prognosis Prognosis for Safe Diet Advancement: Good      Swallow Study   General HPI: Pt is a 83 y.o.malewith medical history significant ofhypertension and dementia who was brought to the ER by the family for evaluation s/p fall. CT Head showed posttraumatic right frontal subarachnoid hemorrhage, small right frontal acute subdural hematoma representing contrecoup injury, left occipital diastasis, scalp hematoma and slight subcutaneous air due to laceration. CT of the cervical spine showed advanced cervical spondylosis. Pt was transferred to [facility] for closer monitoring but with no surgical intervention indicated at this time.  Type of Study: Bedside Swallow Evaluation Previous Swallow Assessment: none in chart Diet Prior to this Study: NPO Temperature Spikes Noted: Yes (101.2) Respiratory Status: Room air History of Recent Intubation: No Behavior/Cognition: Alert;Cooperative;Pleasant mood;Confused;Distractible;Requires cueing Oral Cavity Assessment: Within Functional Limits Oral Care Completed by SLP: No Oral Cavity - Dentition: Edentulous Vision: Functional for self-feeding Self-Feeding Abilities: Able to feed self Patient Positioning: Upright in bed Baseline Vocal Quality: Normal Volitional Cough: Strong Volitional Swallow: Able to elicit    Oral/Motor/Sensory Function Overall Oral Motor/Sensory Function: Within functional limits   Ice Chips Ice chips: Not tested   Thin Liquid Thin Liquid: Impaired Presentation: Self Fed;Cup;Straw Pharyngeal  Phase Impairments: Multiple swallows     Nectar Thick Nectar Thick Liquid: Not tested  Honey Thick Honey Thick Liquid: Not tested   Puree Puree: Within functional limits Presentation: Self Fed;Spoon   Solid   GO   Solid: Impaired Presentation: Self Fed Oral Phase Impairments: Impaired mastication Oral Phase Functional Implications: Oral residue        [name] 05/31/2017,10:48 AM  [name], M.A. CCC-SLP [phone]

## 2017-05-31 NOTE — Plan of Care (Signed)
Problem: Education: Goal: Knowledge of [place] [facility] Education information/materials will improve Outcome: Progressing Daughters verbalize understanding, patient needs reinforcement   Problem: Safety: Goal: Ability to remain free from injury will improve Outcome: Progressing Bed alarm utilized, patient close to nurses station.  Problem: Activity: Goal: Risk for activity intolerance will decrease Outcome: Not Progressing Patient sleeping throughout the shift, patient awakes to verbal stimuli but unwilling to participate in care  Problem: Nutrition: Goal: Adequate nutrition will be maintained Outcome: Not Progressing Patient kept NPO d/t not participating in swallow screen

## 2017-05-31 NOTE — Progress Notes (Signed)
PROGRESS NOTE  [name] ZOX:[id number] DOB: 1923-12-24 DOA: 05/30/2017 PCP: [name], MD   LOS: 1 day   Brief Narrative / Interim history: 83 y.o. male with medical history significant of hypertension, dementia was brought to the ER by the family for evaluation of fall. Patient is slightly confused but somewhat is at his baseline per the daughter at bedside.  He was admitted after a ground-level fall and found to have right frontal subarachnoid hemorrhage as well as small right frontal acute subdural hematoma.  Assessment & Plan: Active Problems:   Essential hypertension   Memory loss   Subarachnoid hemorrhage (HCC)   Subarachnoid hemorrhage and subdural hematoma -Neurosurgery consulted, recommended conservative management.  Repeat CT scan of the brain essentially unchanged -Antiseizure prophylaxis for 7 days -Transfer to floor, continue to closely monitor for 24 more hours and consider discharge home  tomorrow if everything is stable  Low back pain -Lumbar pelvic x-ray without acute findings  Hypertension -Continue home medications  Dementia -Supportive treatment, continue Aricept  Fever -febrile overnight, ?related to fall, UA negative and no clear evidence of infectious processes. Monitor   DVT prophylaxis: SCD Code Status: Full code Family Communication: no family at bedside Disposition Plan: home 1 day, PT evaluation pending  Consultants:   Neurosurgery   Procedures:   None   Antimicrobials:  None    Subjective: - no chest pain, shortness of breath, no abdominal pain, nausea or vomiting.  Does not know why he is here  Objective: Vitals:   05/31/17 0600 05/31/17 0700 05/31/17 0800 05/31/17 0807  BP: (!) 170/78 (!) 144/69 (!) 149/75 (!) 149/75  Pulse: 92 88 93 89  Resp: (!) (!) 22  Temp:    99.1 F (37.3 C)  TempSrc:    Oral  SpO2: 98% 93% 97% 97%  Weight:      Height:        Intake/Output Summary (Last 24 hours) at  05/31/17 1214 Last data filed at 05/31/17 1130  Gross per 24 hour  Intake              180 ml  Output              600 ml  Net             -420 ml   Filed Weights   05/30/17 1147  Weight: 79.8 kg (176 lb)    Examination:  Constitutional: NAD. demented Eyes: lids and conjunctivae normal ENMT: Mucous membranes are moist.  Respiratory: clear to auscultation bilaterally, no wheezing, no crackles. Normal respiratory effort. No accessory muscle use.  Cardiovascular: Regular rate and rhythm, no murmurs / rubs / gallops. No LE edema. 2+ pedal pulses. No carotid bruits.  Abdomen: no tenderness. Bowel sounds positive.  Skin: no rashes, lesions, ulcers. No induration Neurologic: CN 2-12 grossly intact. Strength 5/5 in all 4.   Data Reviewed: I have independently reviewed following labs and imaging studies   CBC:  Recent Labs Lab 05/30/17 1340 05/31/17 0219 05/31/17 0731  WBC 6.1 8.0 8.1  NEUTROABS 5.3  --   --   HGB 13.9 12.1* 12.3*  HCT 42.1 35.7* 37.0*  MCV 94.6 91.8 91.8  PLT 171 143* 150   Basic Metabolic Panel:  Recent Labs Lab 05/30/17 1340 05/31/17 0219  NA 136 135  K 3.6 3.4*  CL 99* 99*  CO2 25 24  GLUCOSE 133* 125*  BUN 21* 16  CREATININE 1.20 1.16  CALCIUM 9.5 9.1  GFR: Estimated Creatinine Clearance: 44.2 mL/min (by C-G formula based on SCr of 1.16 mg/dL). Liver Function Tests:  Recent Labs Lab 05/30/17 1340  AST 29  ALT 22  ALKPHOS 77  BILITOT 0.7  PROT 7.5  ALBUMIN 4.4   No results for input(s): LIPASE, AMYLASE in the last 168 hours. No results for input(s): AMMONIA in the last 168 hours. Coagulation Profile: No results for input(s): INR, PROTIME in the last 168 hours. Cardiac Enzymes:  Recent Labs Lab 05/30/17 1340 05/30/17 2154 05/31/17 0219 05/31/17 0731  TROPONINI 0.17* 0.25* 0.35* 0.42*   BNP (last 3 results) No results for input(s): PROBNP in the last 8760 hours. HbA1C: No results for input(s): HGBA1C in the last 72  hours. CBG: No results for input(s): GLUCAP in the last 168 hours. Lipid Profile: No results for input(s): CHOL, HDL, LDLCALC, TRIG, CHOLHDL, LDLDIRECT in the last 72 hours. Thyroid Function Tests:  Recent Labs  05/31/17 0219  TSH 0.433   Anemia Panel: No results for input(s): VITAMINB12, FOLATE, FERRITIN, TIBC, IRON, RETICCTPCT in the last 72 hours. Urine analysis:    Component Value Date/Time   COLORURINE YELLOW 05/31/2017 1125   APPEARANCEUR CLEAR 05/31/2017 1125   LABSPEC 1.018 05/31/2017 1125   PHURINE 5.0 05/31/2017 1125   GLUCOSEU NEGATIVE 05/31/2017 1125   HGBUR MODERATE (A) 05/31/2017 1125   HGBUR small 10/22/2008 0859   BILIRUBINUR NEGATIVE 05/31/2017 1125   KETONESUR 5 (A) 05/31/2017 1125   PROTEINUR 100 (A) 05/31/2017 1125   UROBILINOGEN 0.2 10/22/2008 0859   NITRITE NEGATIVE 05/31/2017 1125   LEUKOCYTESUR NEGATIVE 05/31/2017 1125   Sepsis Labs: Invalid input(s): PROCALCITONIN, LACTICIDVEN  Recent Results (from the past 240 hour(s))  MRSA PCR Screening     Status: None   Collection Time: 05/30/17  9:08 PM  Result Value Ref Range Status   MRSA by PCR NEGATIVE NEGATIVE Final    Comment:        The GeneXpert MRSA Assay (FDA approved for NASAL specimens only), is one component of a comprehensive MRSA colonization surveillance program. It is not intended to diagnose MRSA infection nor to guide or monitor treatment for MRSA infections.       Radiology Studies: Dg Shoulder 1 View Left  Result Date: 05/30/2017 CLINICAL DATA:  Status post fall striking the left shoulder. EXAM: LEFT SHOULDER - 1 VIEW COMPARISON:  Chest x-ray dated September 25, 2016 which included a portion of the left shoulder. FINDINGS: A single AP of the shoulder has the humerus rotated internally. The bones are subjectively adequately mineralized. There is no definite acute fracture or dislocation. IMPRESSION: No acute bony abnormality of the left shoulder is observed on this single view. A  full shoulder series is recommended. Electronically Signed   By: David  [place] M.D.   On: 05/30/2017 13:15   Ct Head Wo Contrast  Result Date: 05/31/2017 CLINICAL DATA:  Followup head CT for bleed. EXAM: CT HEAD WITHOUT CONTRAST TECHNIQUE: Contiguous axial images were obtained from the base of the skull through the vertex without intravenous contrast. COMPARISON:  05/30/2017 and 11/08/2010 FINDINGS: Brain: Examination demonstrates evidence of patient's acute subarachnoid hemorrhage over the right frontal region without significant change. Tiny acute right subdural hematoma over the right frontal region without significant change. Possible tiny focus of acute subarachnoid hemorrhage over the left posterior temporal region without significant change. Small amount of intraventricular hemorrhage over the occipital horns bilaterally which is new. No evidence of significant mass effect or midline shift. Vascular: No hyperdense vessel  or unexpected calcification. Skull: Persistence of diastases of the left lambdoid suture with adjacent focal soft tissue swelling over the posterior parietal scalp and minimal air within the adjacent soft tissues and inner table of the skull. Sinuses/Orbits: Persistent opacification over the left mastoid air cells likely hemorrhagic debris. Orbits and remaining sinuses are normal. Other: None. IMPRESSION: Stable acute subarachnoid hemorrhage over the right frontal region with stable tiny right frontal acute subdural hematoma. Possible tiny amount of stable acute subarachnoid hemorrhage over the left posterior temporal region. Small amount of acute intraventricular hemorrhage over the occipital horns which is new. Stable diastases of the left lambdoid suture with associated soft tissue swelling of the left posterior parietal scalp. Hemorrhagic debris over the adjacent left mastoid sinus unchanged. Electronically Signed   By: Elberta Fortis M.D.   On: 05/31/2017 02:25   Ct Head Wo  Contrast  Result Date: 05/30/2017 CLINICAL DATA:  Patient found after falling on street.  Dizzy. EXAM: CT HEAD WITHOUT CONTRAST TECHNIQUE: Contiguous axial images were obtained from the base of the skull through the vertex without intravenous contrast. COMPARISON:  CT head 11/08/2010. FINDINGS: Brain: There is evidence for posttraumatic RIGHT frontal subarachnoid hemorrhage over the RIGHT anterior and inferior frontal lobe. Parenchymal contusion difficult to see but not excluded. There is a small acute subdural hematoma anteriorly, up to 4 mm thick. There is a slight interhemispheric component. The hemorrhage represents a contrecoup injury from a fall onto the LEFT occiput. There is diastasis of the LEFT lambdoid suture. There is no occipital bone fracture. There is a LEFT temporal occipital scalp hematoma with laceration. There are a few tiny bubbles of air in the epidural space as seen on image 12 series 2. There is some fluid/blood in the LEFT mastoid air cells. Vascular: No hyperdense vessel or unexpected calcification. Skull: LEFT mastoid suture diastasis.  No frank skull fracture. Sinuses/Orbits: No layering sinus fluid.  No visible orbital injury. Other: No evidence for hemotympanum.  LEFT mastoid fluid/ blood. IMPRESSION: Posttraumatic RIGHT frontal subarachnoid hemorrhage, and small RIGHT frontal acute subdural hematoma, up to 4 mm thick representing a contrecoup injury. Fall onto the LEFT occiput resulting in diastasis of the LEFT lambdoid suture, minimal pneumocephalus, LEFT-sided scalp hematoma, and subcutaneous air due to laceration and/or disruption of the mastoid air cells. Critical Value/emergent results were called by telephone at the time of interpretation on 05/30/2017 at 1:25 pm to Dr. Doug Sou , who verbally acknowledged these results. Electronically Signed   By: Elsie Stain M.D.   On: 05/30/2017 13:33   Ct Cervical Spine Wo Contrast  Result Date: 05/30/2017 CLINICAL DATA:  Larey Seat.   Possible loss of consciousness. EXAM: CT CERVICAL SPINE WITHOUT CONTRAST TECHNIQUE: Multidetector CT imaging of the cervical spine was performed without intravenous contrast. Multiplanar CT image reconstructions were also generated. COMPARISON:  CT head reported separately. FINDINGS: Alignment: Straightening of the normal cervical lordosis. No traumatic subluxation. Skull base and vertebrae: No fracture or worrisome osseous lesion. Soft tissues and spinal canal: No prevertebral fluid or swelling. No visible canal hematoma. Disc levels: Severe disc space narrowing from C2-3 through C7-T1, with varying degrees of osseous spurring. Upper chest: Severe emphysematous change. No pneumothorax or visible upper rib fracture. Other: Nuchal ligament calcification.  Carotid atherosclerosis. IMPRESSION: Advanced cervical spondylosis. No cervical spine fracture or traumatic subluxation. Electronically Signed   By: Elsie Stain M.D.   On: 05/30/2017 15:02   Dg Lumbar Spine 1 View  Result Date: 05/30/2017 CLINICAL DATA:  Low back  pain EXAM: LUMBAR SPINE - 1 VIEW COMPARISON:  Lumbar spine radiograph 07/04/2016 FINDINGS: No acute fracture or listhesis. There is severe disc space loss at all levels, greatest at L4-5 and L5-S1. This is unchanged compared to 07/04/2016. Multilevel severe facet arthrosis is also unchanged. IMPRESSION: Severe degenerative disc and facet disease, unchanged. No compression fracture or static subluxation in the AP direction. Electronically Signed   By: Deatra Robinson M.D.   On: 05/30/2017 18:07   Dg Pelvis Portable  Result Date: 05/30/2017 CLINICAL DATA:  Low back pain.  Fall. EXAM: PORTABLE PELVIS 1-2 VIEWS COMPARISON:  None. FINDINGS: There is complete loss of the joint space of the left hip with associated subchondral mixed sclerotic and lucent change. There is moderate right hip joint space narrowing. No acute fracture or dislocation. IMPRESSION: Mild-to-moderate right and severe left hip  osteoarthrosis. Electronically Signed   By: Deatra Robinson M.D.   On: 05/30/2017 18:16   Dg Shoulder Left  Result Date: 05/30/2017 CLINICAL DATA:  Left shoulder pain after fall. EXAM: LEFT SHOULDER - 2+ VIEW COMPARISON:  Single-view left shoulder from same day. FINDINGS: No acute fracture or malalignment. Mild acromioclavicular degenerative changes. No focal bony abnormality. IMPRESSION: Mild acromioclavicular degenerative changes. No acute osseous abnormality. Electronically Signed   By: Obie Dredge M.D.   On: 05/30/2017 17:17     Scheduled Meds: . aspirin EC  81 mg Oral Daily  . cholecalciferol  5,000 Units Oral Daily  . docusate sodium  100 mg Oral BID  . donepezil  10 mg Oral QHS  . losartan  100 mg Oral Daily   And  . hydrochlorothiazide  25 mg Oral Daily  . multivitamin with minerals  1 tablet Oral Daily  . pantoprazole  40 mg Oral Daily   Or  . pantoprazole sodium  40 mg Per Tube Daily  . senna   Oral QODAY   Continuous Infusions: . sodium chloride 100 mL/hr at 05/30/17 2130  . levETIRAcetam Stopped (05/31/17 0335)    Pamella Pert, MD, PhD [facility] Pager [phone]  If 7PM-7AM, please contact night-coverage [email or web address] Password [email or web address] 05/31/2017, 12:14 PM

## 2017-05-31 NOTE — Evaluation (Signed)
Physical Therapy Evaluation Patient Details Name: [name] MRN: [id number] DOB: 10-May-1924 Today's Date: 05/31/2017   History of Present Illness  83 y.o.malewith medical history significant ofhypertension, dementia admitted after a ground-level fall and found to have right frontal subarachnoid hemorrhage as well as small right frontal acute subdural hematoma.  Clinical Impression  Pt admitted with above diagnosis and presents to PT with functional limitations due to deficits listed below (See PT problem list). Pt needs skilled PT to maximize independence and safety to allow discharge to home with support of family due to cognitive deficits. Expect pt's mobility will return to baseline quickly and that familiar environment likely will improve cognition.     Follow Up Recommendations Home health PT;Supervision/Assistance - 24 hour    Equipment Recommendations  None recommended by PT    Recommendations for Other Services       Precautions / Restrictions Precautions Precautions: Fall Restrictions Weight Bearing Restrictions: No      Mobility  Bed Mobility Overal bed mobility: Needs Assistance Bed Mobility: Supine to Sit     Supine to sit: Min guard;Min assist     General bed mobility comments: min guard assist to come sit EOB; vc for sequencing  Transfers Overall transfer level: Needs assistance Equipment used: 2 person hand held assist;1 person hand held assist Transfers: Sit to/from Stand Sit to Stand: Mod assist         General transfer comment: initially seeking 2 HHA, able to perform with 1 HHA; mod A for balance   Ambulation/Gait Ambulation/Gait assistance: Min assist;+2 physical assistance Ambulation Distance (Feet): 170 Feet Assistive device: 2 person hand held assist;1 person hand held assist Gait Pattern/deviations: Step-through pattern;Decreased stride length;Trunk flexed;Drifts right/left Gait velocity: decr Gait velocity interpretation: <1.8  ft/sec, indicative of risk for recurrent falls General Gait Details: Initially pt seeking bil hand held assist for balance. After amb ~100' pt able to release and only required 1 hand held  Stairs            Wheelchair Mobility    Modified Rankin (Stroke Patients Only)       Balance Overall balance assessment: Needs assistance Sitting-balance support: Single extremity supported;Feet supported Sitting balance-Leahy Scale: Fair     Standing balance support: Single extremity supported;Bilateral upper extremity supported;During functional activity Standing balance-Leahy Scale: Poor Standing balance comment: reliant on external support                             Pertinent Vitals/Pain Pain Assessment: Faces Faces Pain Scale: Hurts a little bit Pain Location: lower back Pain Descriptors / Indicators: Aching Pain Intervention(s): Monitored during session;Repositioned    Home Living Family/patient expects to be discharged to:: Private residence Living Arrangements: Spouse/significant other;Children Available Help at Discharge: Family;Available 24 hours/day Type of Home: House Home Access: Stairs to enter Entrance Stairs-Rails: None Entrance Stairs-Number of Steps: 3 Home Layout: One level Home Equipment: Cane - single point      Prior Function Level of Independence: Independent with assistive device(s)         Comments: ambulated with assist from [facility]     Hand Dominance        Extremity/Trunk Assessment   Upper Extremity Assessment Upper Extremity Assessment: Defer to OT evaluation    Lower Extremity Assessment Lower Extremity Assessment: Generalized weakness    Cervical / Trunk Assessment Cervical / Trunk Assessment: Kyphotic  Communication   Communication: No difficulties  Cognition Arousal/Alertness: Awake/alert Behavior During  Therapy: WFL for tasks assessed/performed Overall Cognitive Status: Impaired/Different from baseline Area of  Impairment: Orientation;Attention;Memory;Following commands;Safety/judgement;Awareness;Problem solving                 Orientation Level: Disoriented to;Place;Time;Situation Current Attention Level: Sustained Memory: Decreased short-term memory Following Commands: Follows one step commands inconsistently Safety/Judgement: Decreased awareness of safety;Decreased awareness of deficits Awareness: Emergent Problem Solving: Slow processing;Decreased initiation;Difficulty sequencing;Requires verbal cues General Comments: Pt unreliable historian; Pt not aware of where he was despite telling him several times, Pt very difficult to redirect during session for following commands. Pt's daughter seemed unphased by his current level of cognitive orientation      General Comments      Exercises     Assessment/Plan    PT Assessment Patient needs continued PT services  PT Problem List Decreased strength;Decreased activity tolerance;Decreased balance;Decreased mobility;Decreased cognition;Decreased safety awareness       PT Treatment Interventions DME instruction;Gait training;Stair training;Functional mobility training;Therapeutic activities;Therapeutic exercise;Balance training;Patient/family education    PT Goals (Current goals can be found in the Care Plan section)  Acute Rehab PT Goals Patient Stated Goal: to get back home PT Goal Formulation: Patient unable to participate in goal setting Time For Goal Achievement: 06/07/17 Potential to Achieve Goals: Good    Frequency Min 3X/week   Barriers to discharge Inaccessible home environment stairs to enter hom    Co-evaluation PT/OT/SLP Co-Evaluation/Treatment: Yes Reason for Co-Treatment: Necessary to address cognition/behavior during functional activity;For patient/therapist safety PT goals addressed during session: Mobility/safety with mobility OT goals addressed during session: ADL's and self-care       AM-PAC PT "6 Clicks"  Daily Activity  Outcome Measure Difficulty turning over in bed (including adjusting bedclothes, sheets and blankets)?: A Little Difficulty moving from lying on back to sitting on the side of the bed? : A Lot Difficulty sitting down on and standing up from a chair with arms (e.g., wheelchair, bedside commode, etc,.)?: Unable Help needed moving to and from a bed to chair (including a wheelchair)?: A Little Help needed walking in hospital room?: A Little Help needed climbing 3-5 steps with a railing? : A Lot 6 Click Score: 14    End of Session Equipment Utilized During Treatment: Gait belt Activity Tolerance: Patient tolerated treatment well Patient left: in chair;with call bell/phone within reach;with chair alarm set;with family/visitor present Nurse Communication: Mobility status PT Visit Diagnosis: Unsteadiness on feet (R26.81);Muscle weakness (generalized) (M62.81);Other abnormalities of gait and mobility (R26.89)    Time: 1610-9604 PT Time Calculation (min) (ACUTE ONLY): 24 min   Charges:   PT Evaluation $PT Eval Moderate Complexity: 1 Mod     PT G Codes:       [facility] PT [phone]   Angelina Ok [facility] 05/31/2017, 4:12 PM

## 2017-05-31 NOTE — Progress Notes (Signed)
SLP Cancellation Note  Patient Details Name: [name] MRN: [id number] DOB: 11-28-1923   Cancelled treatment:       Reason Eval/Treat Not Completed: Medical issues which prohibited therapy. Discussed pt with RN - given acute hemorrhage on CT, will hold PO trials at this time. Will f/u as able.   Maxcine Ham 05/31/2017, 9:17 AM  Maxcine Ham, M.A. CCC-SLP [phone]

## 2017-05-31 NOTE — Progress Notes (Signed)
OT Cancellation Note  Patient Details Name: Quashaun Lazalde MRN: [id number] DOB: 1924-05-23   Cancelled Treatment:    Reason Eval/Treat Not Completed: Medical issues which prohibited therapy. Pt is currently on bedrest. OT will follow for evaluation as activity orders are updated.   Evern Bio Ashten Prats 05/31/2017, 9:43 AM  Sherryl Manges OTR/L [phone]

## 2017-05-31 NOTE — Consult Note (Signed)
Chief Complaint   Chief Complaint  Patient presents with  . Fall    HPI   HPI: [name] is a 84 y.o. male Who presented to [facility] Emergency room yesterday after falling.  Patient has dementia.  There is no family at bedside.  History is obtained through chart review.  The daughter reportedly stated that the patient without [place] he was witnessed to fall and hit his head. No LOC.  Reportedly overall at baseline during initial eval.  Neurosurgery consult was requested due to subdural hematoma seen on imaging.  He was transferred to [facility] for monitoring at the request of the family.  There has been no change in his mental status since arrival to [facility].  Patient Active Problem List   Diagnosis Date Noted  . Subarachnoid hemorrhage ([place]) 05/30/2017  . LBBB (left bundle branch block) 09/25/2016  . Syncope and collapse 09/25/2016  . Syncope 09/25/2016  . 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 BACK PAIN, LUMBAR, WITH RADICULOPATHY 03/27/2008  . MEMORY LOSS 03/27/2008  . ALLERGIC RHINITIS 12/26/2007  . ERECTILE DYSFUNCTION 08/31/2007  . CONSTIPATION 03/22/2007  . ALLERGIC RHINITIS, SEASONAL 01/04/2007  . DEGENERATIVE JOINT DISEASE 12/27/2006  . BENIGN PROSTATIC HYPERTROPHY, WITH OBSTRUCTION 11/14/2006  . HYPERLIPIDEMIA 07/24/2006  . Essential hypertension 07/24/2006  . VENTRICULAR HYPERTROPHY, LEFT 07/24/2006  . RAYNAUD'S DISEASE 07/24/2006  . VENOUS INSUFFICIENCY 07/24/2006  . COLONIC POLYPS, HX OF 07/24/2006    PMH: Past Medical History:  Diagnosis Date  . Arthritis   . Hypertension     PSH: Past Surgical History:  Procedure Laterality Date  . HERNIA REPAIR      Prescriptions Prior to Admission  Medication Sig Dispense Refill Last Dose  . acetaminophen (TYLENOL) 500 MG tablet Take 500-1,000 mg by mouth every 6 (six) hours as needed for moderate pain.   05/29/2017 at Unknown time  . aspirin EC 81 MG tablet Take 81 mg by mouth  daily.   05/29/2017 at Unknown time  . Cholecalciferol (VITAMIN D-3) 5000 units TABS Take 5,000 Units by mouth daily.   05/29/2017 at Unknown time  . donepezil (ARICEPT) 10 MG tablet Take 1 tablet (10 mg total) by mouth at bedtime.   05/29/2017 at Unknown time  . GARLIC PO Take 1 capsule by mouth daily.   09/25/2016 at Unknown time  . Lidocaine-Menthol (ICY HOT LIDOCAINE PLUS MENTHOL EX) Apply 1 application topically 5 (five) times daily as needed (back pain).   Past Week at Unknown time  . LORazepam (ATIVAN) 0.5 MG tablet Take 0.5 mg by mouth daily as needed for anxiety.   05/29/2017 at Unknown time  . losartan-hydrochlorothiazide (HYZAAR) 100-25 MG tablet Take 1 tablet by mouth daily.   05/29/2017 at Unknown time  . Menthol, Topical Analgesic, (BIOFREEZE EX) Apply 1 application topically 5 (five) times daily as needed (back pain).   Past Week at Unknown time  . Multiple Vitamin (MULTIVITAMIN WITH MINERALS) TABS tablet Take 1 tablet by mouth daily.   05/29/2017 at Unknown time  . Omega-3 Fatty Acids (FISH OIL) 1000 MG CAPS Take 1,000 mg by mouth daily.   Completed Course at Unknown time  . Sennosides (SENNA LAXATIVE) 25 MG TABS Take 25 mg by mouth every other day.   09/25/2016 at Unknown time    SH: Social History  Substance Use Topics  . Smoking status: Never Smoker  . Smokeless tobacco: Never Used  . Alcohol use No    MEDS:  Prior to Admission medications   Medication Sig Start Date End Date Taking? Authorizing Provider  acetaminophen (TYLENOL) 500 MG tablet Take 500-1,000 mg by mouth every 6 (six) hours as needed for moderate pain.   Yes [provider]  aspirin EC 81 MG tablet Take 81 mg by mouth daily.   Yes [provider]  Cholecalciferol (VITAMIN D-3) 5000 units TABS Take 5,000 Units by mouth daily.   Yes [provider]  donepezil (ARICEPT) 10 MG tablet Take 1 tablet (10 mg total) by mouth at bedtime. 09/27/16  Yes Vann, Jessica U, DO  GARLIC PO Take 1 capsule  by mouth daily.   Yes [provider]  Lidocaine-Menthol (ICY HOT LIDOCAINE PLUS MENTHOL EX) Apply 1 application topically 5 (five) times daily as needed (back pain).   Yes [provider]  LORazepam (ATIVAN) 0.5 MG tablet Take 0.5 mg by mouth daily as needed for anxiety.   Yes [provider]  losartan-hydrochlorothiazide (HYZAAR) 100-25 MG tablet Take 1 tablet by mouth daily. 08/20/15  Yes [provider]  Menthol, Topical Analgesic, (BIOFREEZE EX) Apply 1 application topically 5 (five) times daily as needed (back pain).   Yes [provider]  Multiple Vitamin (MULTIVITAMIN WITH MINERALS) TABS tablet Take 1 tablet by mouth daily.   Yes [provider]  Omega-3 Fatty Acids (FISH OIL) 1000 MG CAPS Take 1,000 mg by mouth daily.   Yes [provider]  Sennosides (SENNA LAXATIVE) 25 MG TABS Take 25 mg by mouth every other day.   Yes [provider]    ALLERGY: No Known Allergies  Social History  Substance Use Topics  . Smoking status: Never Smoker  . Smokeless tobacco: Never Used  . Alcohol use No     Family History  Problem Relation Age of Onset  . CAD Father        died of MI in his 42's     ROS   ROS   Unable to obtain reliable review of systems due to dementia  Exam   Vitals:   05/31/17 0700 05/31/17 0807  BP: (!) 144/69 (!) 149/75  Pulse: 88 89  Resp: 15 (!) 22  Temp:  99.1 F (37.3 C)  SpO2: 93% 97%   General appearance:   Elderly African American male.  Resting comfortably.  No apparent distress. Eyes: PERRL Cardiovascular: Regular rate and rhythm without murmurs, rubs, gallops. No edema or variciosities. Distal pulses normal. Pulmonary: Clear to auscultation Musculoskeletal:     Muscle tone upper extremities: Normal    Muscle tone lower extremities: Normal    Motor exam: Upper Extremities Deltoid Bicep Tricep Grip  Right 5/5 5/5 5/5 5/5  Left 5/5 5/5 5/5 5/5   Lower Extremity IP Quad PF  DF EHL  Right 5/5 5/5 5/5 5/5 5/5  Left 5/5 5/5 5/5 5/5 5/5   Neurological Awake, alert Oriented to self, date of birth and location.  Not oriented to year. Speech appropriate CNII: Visual fields normal CNIII/IV/VI: EOMI CNV: Facial sensation normal CNVII: Symmetric, normal strength CNVIII: Grossly normal CNIX: Normal palate movement CNXI: Trap and SCM strength normal CN XII: Tongue protrusion normal Sensation grossly intact to LT  Results - Imaging/Labs   Results for orders placed or performed during the hospital encounter of 05/30/17 (from the past 48 hour(s))  CBC with Differential/Platelet     Status: Abnormal   Collection Time: 05/30/17  1:40 PM  Result Value Ref Range   WBC 6.1 4.0 - 10.5  K/uL   RBC 4.45 4.22 - 5.81 MIL/uL   Hemoglobin 13.9 13.0 - 17.0 g/dL   HCT 42.1 39.0 - 52.0 %   MCV 94.6 78.0 - 100.0 fL   MCH 31.2 26.0 - 34.0 pg   MCHC 33.0 30.0 - 36.0 g/dL   RDW 12.8 11.5 - 15.5 %   Platelets 171 150 - 400 K/uL   Neutrophils Relative % 87 %   Neutro Abs 5.3 1.7 - 7.7 K/uL   Lymphocytes Relative 8 %   Lymphs Abs 0.5 (L) 0.7 - 4.0 K/uL   Monocytes Relative 4 %   Monocytes Absolute 0.2 0.1 - 1.0 K/uL   Eosinophils Relative 1 %   Eosinophils Absolute 0.0 0.0 - 0.7 K/uL   Basophils Relative 0 %   Basophils Absolute 0.0 0.0 - 0.1 K/uL  Comprehensive metabolic panel     Status: Abnormal   Collection Time: 05/30/17  1:40 PM  Result Value Ref Range   Sodium 136 135 - 145 mmol/L   Potassium 3.6 3.5 - 5.1 mmol/L   Chloride 99 (L) 101 - 111 mmol/L   CO2 25 22 - 32 mmol/L   Glucose, Bld 133 (H) 65 - 99 mg/dL   BUN 21 (H) 6 - 20 mg/dL   Creatinine, Ser 1.20 0.61 - 1.24 mg/dL   Calcium 9.5 8.9 - 10.3 mg/dL   Total Protein 7.5 6.5 - 8.1 g/dL   Albumin 4.4 3.5 - 5.0 g/dL   AST 29 15 - 41 U/L   ALT 22 17 - 63 U/L   Alkaline Phosphatase 77 38 - 126 U/L   Total Bilirubin 0.7 0.3 - 1.2 mg/dL   GFR calc non Af Amer 51 (L) >60 mL/min   GFR calc Af Amer 59 (L) >60  mL/min    Comment: (NOTE) The eGFR has been calculated using the CKD EPI equation. This calculation has not been validated in all clinical situations. eGFR's persistently <60 mL/min signify possible Chronic Kidney Disease.    Anion gap 12 5 - 15  Troponin I (q 6hr x 3)     Status: Abnormal   Collection Time: 05/30/17  1:40 PM  Result Value Ref Range   Troponin I 0.17 (HH) <0.03 ng/mL    Comment: CRITICAL RESULT CALLED TO, READ BACK BY AND VERIFIED WITH: WILLEY,E AT 3335 ON 10.2.2018 BY ISLEY,B   MRSA PCR Screening     Status: None   Collection Time: 05/30/17  9:08 PM  Result Value Ref Range   MRSA by PCR NEGATIVE NEGATIVE    Comment:        The GeneXpert MRSA Assay (FDA approved for NASAL specimens only), is one component of a comprehensive MRSA colonization surveillance program. It is not intended to diagnose MRSA infection nor to guide or monitor treatment for MRSA infections.   Troponin I     Status: Abnormal   Collection Time: 05/30/17  9:54 PM  Result Value Ref Range   Troponin I 0.25 (HH) <0.03 ng/mL    Comment: CRITICAL RESULT CALLED TO, READ BACK BY AND VERIFIED WITH: MIZE J,RN 05/30/17 2309 WAYK   CBC     Status: Abnormal   Collection Time: 05/31/17  2:19 AM  Result Value Ref Range   WBC 8.0 4.0 - 10.5 K/uL   RBC 3.89 (L) 4.22 - 5.81 MIL/uL   Hemoglobin 12.1 (L) 13.0 - 17.0 g/dL   HCT 35.7 (L) 39.0 - 52.0 %   MCV 91.8 78.0 - 100.0 fL  MCH 31.1 26.0 - 34.0 pg   MCHC 33.9 30.0 - 36.0 g/dL   RDW 13.0 11.5 - 15.5 %   Platelets 143 (L) 150 - 400 K/uL  Basic metabolic panel     Status: Abnormal   Collection Time: 05/31/17  2:19 AM  Result Value Ref Range   Sodium 135 135 - 145 mmol/L   Potassium 3.4 (L) 3.5 - 5.1 mmol/L   Chloride 99 (L) 101 - 111 mmol/L   CO2 24 22 - 32 mmol/L   Glucose, Bld 125 (H) 65 - 99 mg/dL   BUN 16 6 - 20 mg/dL   Creatinine, Ser 1.16 0.61 - 1.24 mg/dL   Calcium 9.1 8.9 - 10.3 mg/dL   GFR calc non Af Amer 53 (L) >60 mL/min   GFR  calc Af Amer >60 >60 mL/min    Comment: (NOTE) The eGFR has been calculated using the CKD EPI equation. This calculation has not been validated in all clinical situations. eGFR's persistently <60 mL/min signify possible Chronic Kidney Disease.    Anion gap 12 5 - 15  TSH     Status: None   Collection Time: 05/31/17  2:19 AM  Result Value Ref Range   TSH 0.433 0.350 - 4.500 uIU/mL    Comment: Performed by a 3rd Generation assay with a functional sensitivity of <=0.01 uIU/mL.  Troponin I     Status: Abnormal   Collection Time: 05/31/17  2:19 AM  Result Value Ref Range   Troponin I 0.35 (HH) <0.03 ng/mL    Comment: CRITICAL VALUE NOTED.  VALUE IS CONSISTENT WITH PREVIOUSLY REPORTED AND CALLED VALUE.  Urine rapid drug screen (hosp performed)     Status: None   Collection Time: 05/31/17  6:16 AM  Result Value Ref Range   Opiates NONE DETECTED NONE DETECTED   Cocaine NONE DETECTED NONE DETECTED   Benzodiazepines NONE DETECTED NONE DETECTED   Amphetamines NONE DETECTED NONE DETECTED   Tetrahydrocannabinol NONE DETECTED NONE DETECTED   Barbiturates NONE DETECTED NONE DETECTED    Comment:        DRUG SCREEN FOR MEDICAL PURPOSES ONLY.  IF CONFIRMATION IS NEEDED FOR ANY PURPOSE, NOTIFY LAB WITHIN 5 DAYS.        LOWEST DETECTABLE LIMITS FOR URINE DRUG SCREEN Drug Class       Cutoff (ng/mL) Amphetamine      1000 Barbiturate      200 Benzodiazepine   267 Tricyclics       124 Opiates          300 Cocaine          300 THC              50     Dg Shoulder 1 View Left  Result Date: 05/30/2017 CLINICAL DATA:  Status post fall striking the left shoulder. EXAM: LEFT SHOULDER - 1 VIEW COMPARISON:  Chest x-ray dated September 25, 2016 which included a portion of the left shoulder. FINDINGS: A single AP of the shoulder has the humerus rotated internally. The bones are subjectively adequately mineralized. There is no definite acute fracture or dislocation. IMPRESSION: No acute bony abnormality of  the left shoulder is observed on this single view. A full shoulder series is recommended. Electronically Signed   By: David  Martinique M.D.   On: 05/30/2017 13:15   Ct Head Wo Contrast  Result Date: 05/31/2017 CLINICAL DATA:  Followup head CT for bleed. EXAM: CT HEAD WITHOUT CONTRAST TECHNIQUE: Contiguous axial images were  obtained from the base of the skull through the vertex without intravenous contrast. COMPARISON:  05/30/2017 and 11/08/2010 FINDINGS: Brain: Examination demonstrates evidence of patient's acute subarachnoid hemorrhage over the right frontal region without significant change. Tiny acute right subdural hematoma over the right frontal region without significant change. Possible tiny focus of acute subarachnoid hemorrhage over the left posterior temporal region without significant change. Small amount of intraventricular hemorrhage over the occipital horns bilaterally which is new. No evidence of significant mass effect or midline shift. Vascular: No hyperdense vessel or unexpected calcification. Skull: Persistence of diastases of the left lambdoid suture with adjacent focal soft tissue swelling over the posterior parietal scalp and minimal air within the adjacent soft tissues and inner table of the skull. Sinuses/Orbits: Persistent opacification over the left mastoid air cells likely hemorrhagic debris. Orbits and remaining sinuses are normal. Other: None. IMPRESSION: Stable acute subarachnoid hemorrhage over the right frontal region with stable tiny right frontal acute subdural hematoma. Possible tiny amount of stable acute subarachnoid hemorrhage over the left posterior temporal region. Small amount of acute intraventricular hemorrhage over the occipital horns which is new. Stable diastases of the left lambdoid suture with associated soft tissue swelling of the left posterior parietal scalp. Hemorrhagic debris over the adjacent left mastoid sinus unchanged. Electronically Signed   By: Elberta Fortis M.D.   On: 05/31/2017 02:25   Ct Head Wo Contrast  Result Date: 05/30/2017 CLINICAL DATA:  Patient found after falling on street.  Dizzy. EXAM: CT HEAD WITHOUT CONTRAST TECHNIQUE: Contiguous axial images were obtained from the base of the skull through the vertex without intravenous contrast. COMPARISON:  CT head 11/08/2010. FINDINGS: Brain: There is evidence for posttraumatic RIGHT frontal subarachnoid hemorrhage over the RIGHT anterior and inferior frontal lobe. Parenchymal contusion difficult to see but not excluded. There is a small acute subdural hematoma anteriorly, up to 4 mm thick. There is a slight interhemispheric component. The hemorrhage represents a contrecoup injury from a fall onto the LEFT occiput. There is diastasis of the LEFT lambdoid suture. There is no occipital bone fracture. There is a LEFT temporal occipital scalp hematoma with laceration. There are a few tiny bubbles of air in the epidural space as seen on image 12 series 2. There is some fluid/blood in the LEFT mastoid air cells. Vascular: No hyperdense vessel or unexpected calcification. Skull: LEFT mastoid suture diastasis.  No frank skull fracture. Sinuses/Orbits: No layering sinus fluid.  No visible orbital injury. Other: No evidence for hemotympanum.  LEFT mastoid fluid/ blood. IMPRESSION: Posttraumatic RIGHT frontal subarachnoid hemorrhage, and small RIGHT frontal acute subdural hematoma, up to 4 mm thick representing a contrecoup injury. Fall onto the LEFT occiput resulting in diastasis of the LEFT lambdoid suture, minimal pneumocephalus, LEFT-sided scalp hematoma, and subcutaneous air due to laceration and/or disruption of the mastoid air cells. Critical Value/emergent results were called by telephone at the time of interpretation on 05/30/2017 at 1:25 pm to Dr. Doug Sou , who verbally acknowledged these results. Electronically Signed   By: Elsie Stain M.D.   On: 05/30/2017 13:33   Ct Cervical Spine Wo  Contrast  Result Date: 05/30/2017 CLINICAL DATA:  Larey Seat.  Possible loss of consciousness. EXAM: CT CERVICAL SPINE WITHOUT CONTRAST TECHNIQUE: Multidetector CT imaging of the cervical spine was performed without intravenous contrast. Multiplanar CT image reconstructions were also generated. COMPARISON:  CT head reported separately. FINDINGS: Alignment: Straightening of the normal cervical lordosis. No traumatic subluxation. Skull base and vertebrae: No fracture or worrisome osseous  lesion. Soft tissues and spinal canal: No prevertebral fluid or swelling. No visible canal hematoma. Disc levels: Severe disc space narrowing from C2-3 through C7-T1, with varying degrees of osseous spurring. Upper chest: Severe emphysematous change. No pneumothorax or visible upper rib fracture. Other: Nuchal ligament calcification.  Carotid atherosclerosis. IMPRESSION: Advanced cervical spondylosis. No cervical spine fracture or traumatic subluxation. Electronically Signed   By: Staci Righter M.D.   On: 05/30/2017 15:02   Dg Lumbar Spine 1 View  Result Date: 05/30/2017 CLINICAL DATA:  Low back pain EXAM: LUMBAR SPINE - 1 VIEW COMPARISON:  Lumbar spine radiograph 07/04/2016 FINDINGS: No acute fracture or listhesis. There is severe disc space loss at all levels, greatest at L4-5 and L5-S1. This is unchanged compared to 07/04/2016. Multilevel severe facet arthrosis is also unchanged. IMPRESSION: Severe degenerative disc and facet disease, unchanged. No compression fracture or static subluxation in the AP direction. Electronically Signed   By: Ulyses Jarred M.D.   On: 05/30/2017 18:07   Dg Pelvis Portable  Result Date: 05/30/2017 CLINICAL DATA:  Low back pain.  Fall. EXAM: PORTABLE PELVIS 1-2 VIEWS COMPARISON:  None. FINDINGS: There is complete loss of the joint space of the left hip with associated subchondral mixed sclerotic and lucent change. There is moderate right hip joint space narrowing. No acute fracture or dislocation.  IMPRESSION: Mild-to-moderate right and severe left hip osteoarthrosis. Electronically Signed   By: Ulyses Jarred M.D.   On: 05/30/2017 18:16   Dg Shoulder Left  Result Date: 05/30/2017 CLINICAL DATA:  Left shoulder pain after fall. EXAM: LEFT SHOULDER - 2+ VIEW COMPARISON:  Single-view left shoulder from same day. FINDINGS: No acute fracture or malalignment. Mild acromioclavicular degenerative changes. No focal bony abnormality. IMPRESSION: Mild acromioclavicular degenerative changes. No acute osseous abnormality. Electronically Signed   By: Titus Dubin M.D.   On: 05/30/2017 17:17    Impression/Plan   83 y.o. male s/p unclear mechanical fall or syncopal episode (unclear based on history)  With posttraumatic subarachnoid hemorrhage and subdural hematoma.  He also has diastasis of the left lambdoid suture with minimal pneumocephalus.   A repeat scan was performed earlier this morning  Which was essentially stable although he does have a small amount of acute intraventricular hemorrhage without hydrocephalus.  Neurologically he appears to be well.  He moves all his extremities well with good strength. No focal weakness.  He reportedly has remained at baseline mentally.   There is really no role for neurosurgical intervention for this.  Based on his age and comorbidities, it we be more of a risk to perform any type of surgery should it amount to that.  Recommend continued supportive care.  He should continue Keppra 500 milligrams twice daily for seizure prophylaxis.  He can be discharged once stable medically.

## 2017-05-31 NOTE — Progress Notes (Signed)
Patient failed swallow screen d/t not being able to drink water without coughing. Patient kept NPO and SLP order modified for swallowing evaluation.

## 2017-06-01 ENCOUNTER — Inpatient Hospital Stay (HOSPITAL_COMMUNITY): Payer: Medicare HMO

## 2017-06-01 DIAGNOSIS — J690 Pneumonitis due to inhalation of food and vomit: Secondary | ICD-10-CM

## 2017-06-01 DIAGNOSIS — R509 Fever, unspecified: Secondary | ICD-10-CM

## 2017-06-01 LAB — BASIC METABOLIC PANEL
ANION GAP: 12 (ref 5–15)
BUN: 13 mg/dL (ref 6–20)
CALCIUM: 8.7 mg/dL — AB (ref 8.9–10.3)
CHLORIDE: 96 mmol/L — AB (ref 101–111)
CO2: 25 mmol/L (ref 22–32)
Creatinine, Ser: 1.13 mg/dL (ref 0.61–1.24)
GFR calc Af Amer: >60 mL/min (ref >60)
GFR calc non Af Amer: 55 mL/min — ABNORMAL LOW (ref >60)
GLUCOSE: 109 mg/dL — AB (ref 65–99)
Potassium: 3.5 mmol/L (ref 3.5–5.1)
Sodium: 133 mmol/L — ABNORMAL LOW (ref 135–145)

## 2017-06-01 LAB — CBC
HEMATOCRIT: 38.2 % — AB (ref 39.0–52.0)
Hemoglobin: 13 g/dL (ref 13.0–17.0)
MCH: 31.3 pg (ref 26.0–34.0)
MCHC: 34 g/dL (ref 30.0–36.0)
MCV: 91.8 fL (ref 78.0–100.0)
Platelets: 130 10*3/uL — ABNORMAL LOW (ref 150–400)
RBC: 4.16 MIL/uL — ABNORMAL LOW (ref 4.22–5.81)
RDW: 12.7 % (ref 11.5–15.5)
WBC: 6.8 10*3/uL (ref 4.0–10.5)

## 2017-06-01 MED ORDER — SODIUM CHLORIDE 0.9 % IV SOLN
1.5000 g | Freq: Two times a day (BID) | INTRAVENOUS | Status: DC
  Administered 2017-06-01: 1.5 g via INTRAVENOUS
  Filled 2017-06-01: qty 1.5

## 2017-06-01 MED ORDER — ENSURE ENLIVE PO LIQD
237.0000 mL | Freq: Three times a day (TID) | ORAL | Status: DC
  Administered 2017-06-02 – 2017-06-06 (×10): 237 mL via ORAL

## 2017-06-01 MED ORDER — ACETAMINOPHEN 650 MG RE SUPP: 650.0000 mg | RECTAL | Status: DC | PRN

## 2017-06-01 MED ORDER — PANTOPRAZOLE SODIUM 40 MG PO TBEC
40.0000 mg | DELAYED_RELEASE_TABLET | Freq: Every day | ORAL | Status: DC
  Administered 2017-06-02 – 2017-06-06 (×3): 40 mg via ORAL
  Filled 2017-06-01 (×4): qty 1

## 2017-06-01 MED ORDER — ACETAMINOPHEN 325 MG PO TABS
650.0000 mg | ORAL_TABLET | ORAL | Status: DC | PRN
  Administered 2017-06-01 – 2017-06-03 (×2): 650 mg via ORAL
  Filled 2017-06-01 (×2): qty 2

## 2017-06-01 MED ORDER — LEVETIRACETAM 100 MG/ML PO SOLN
500.0000 mg | Freq: Two times a day (BID) | ORAL | Status: DC
  Administered 2017-06-01: 500 mg via ORAL
  Filled 2017-06-01: qty 5

## 2017-06-01 MED ORDER — DOCUSATE SODIUM 50 MG/5ML PO LIQD
100.0000 mg | Freq: Two times a day (BID) | ORAL | Status: DC
  Administered 2017-06-01 – 2017-06-06 (×10): 100 mg via ORAL
  Filled 2017-06-01 (×11): qty 10

## 2017-06-01 MED ORDER — PANTOPRAZOLE SODIUM 40 MG PO PACK
40.0000 mg | PACK | Freq: Every day | ORAL | Status: DC
  Administered 2017-06-01 – 2017-06-03 (×2): 40 mg via ORAL
  Filled 2017-06-01 (×3): qty 20

## 2017-06-01 MED ORDER — LEVETIRACETAM 100 MG/ML PO SOLN
500.0000 mg | Freq: Two times a day (BID) | ORAL | Status: DC
  Administered 2017-06-01 – 2017-06-06 (×9): 500 mg via ORAL
  Filled 2017-06-01 (×10): qty 5

## 2017-06-01 MED ORDER — SODIUM CHLORIDE 0.9 % IV SOLN
1.5000 g | Freq: Three times a day (TID) | INTRAVENOUS | Status: DC
  Administered 2017-06-01 – 2017-06-02 (×2): 1.5 g via INTRAVENOUS
  Filled 2017-06-01 (×3): qty 1.5

## 2017-06-01 MED ORDER — ACETAMINOPHEN 160 MG/5ML PO SOLN: 650.0000 mg | ORAL | Status: DC | PRN

## 2017-06-01 MED ORDER — AMLODIPINE BESYLATE 10 MG PO TABS
10.0000 mg | ORAL_TABLET | Freq: Every day | ORAL | Status: DC
  Administered 2017-06-01 – 2017-06-03 (×3): 10 mg via ORAL
  Filled 2017-06-01 (×2): qty 1

## 2017-06-01 NOTE — Progress Notes (Signed)
PM progress note  Pt participative, but needing maximal directional cues and assist.   06/01/17 1500  PT Visit Information  Last PT Received On 06/01/17  Assistance Needed +2  History of Present Illness 83 y.o.malewith medical history significant ofhypertension, dementia admitted after a ground-level fall and found to have right frontal subarachnoid hemorrhage as well as small right frontal acute subdural hematoma.  Subjective Data  Patient Stated Goal to get back home  Precautions  Precautions Fall  Precaution Comments perseverative and impulsive, minimal focus  Pain Assessment  Pain Assessment Faces  Faces Pain Scale 0  Cognition  Arousal/Alertness Awake/alert  Behavior During Therapy Restless;Impulsive  Overall Cognitive Status Impaired/Different from baseline  Area of Impairment Orientation;Attention;Memory;Following commands;Safety/judgement;Awareness;Problem solving  Orientation Level Disoriented to;Place;Time;Situation  Current Attention Level Sustained  Memory Decreased short-term memory  Following Commands Follows one step commands inconsistently  Safety/Judgement Decreased awareness of safety;Decreased awareness of deficits  Awareness Emergent;Intellectual  Problem Solving Slow processing;Decreased initiation;Difficulty sequencing;Requires verbal cues  Bed Mobility  Overal bed mobility Needs Assistance  Bed Mobility Supine to Sit;Sit to Supine  Supine to sit Min assist  Sit to supine Min guard  General bed mobility comments directional cues and assist  Transfers  Overall transfer level Needs assistance  Equipment used Straight cane  Transfers Sit to/from Stand  Sit to Stand Min assist;Mod assist  General transfer comment directional cues and assist.    Ambulation/Gait  Ambulation/Gait assistance Min assist;Mod assist  Ambulation Distance (Feet) 170 Feet  Assistive device Straight cane;1 person hand held assist  Gait Pattern/deviations Step-through  pattern;Decreased step length - right;Decreased step length - left  General Gait Details Pt's gait mildly unsteady overall, but degraded with fatigue.  Safe use of cane as he got tired, but initially carrying the cane.  Extra support at end of gait trial for pt getting a little out of control.  Per RN  pt's EHR was in the 140's-150's  Gait velocity decr  Gait velocity interpretation Below normal speed for age/gender  Balance  Overall balance assessment Needs assistance  Sitting-balance support No upper extremity supported;Single extremity supported  Sitting balance-Leahy Scale Fair  Standing balance-Leahy Scale Poor  Standing balance comment much time spent in the bathroom with pericare, pt staggering, perseverating on wipe himself after being cleaned fully  PT - End of Session  Activity Tolerance Patient tolerated treatment well;Patient limited by fatigue  Patient left in bed;with nursing/sitter in room  Nurse Communication Mobility status  PT - Assessment/Plan  PT Plan Current plan remains appropriate  PT Visit Diagnosis Unsteadiness on feet (R26.81);Muscle weakness (generalized) (M62.81);Other abnormalities of gait and mobility (R26.89)  PT Frequency (ACUTE ONLY) Min 3X/week  Follow Up Recommendations SNF  PT equipment None recommended by PT  AM-PAC PT "6 Clicks" Daily Activity Outcome Measure  Difficulty turning over in bed (including adjusting bedclothes, sheets and blankets)? 2  Difficulty moving from lying on back to sitting on the side of the bed?  2  Difficulty sitting down on and standing up from a chair with arms (e.g., wheelchair, bedside commode, etc,.)? 1  Help needed moving to and from a bed to chair (including a wheelchair)? 3  Help needed walking in hospital room? 2  Help needed climbing 3-5 steps with a railing?  2  6 Click Score 12  Mobility G Code  CL  PT Goal Progression  Progress towards PT goals Progressing toward goals  Acute Rehab PT Goals  PT Goal Formulation  Patient unable to participate in  goal setting  Time For Goal Achievement 06/07/17  Potential to Achieve Goals Good  PT Time Calculation  PT Start Time (ACUTE ONLY) 1405  PT Stop Time (ACUTE ONLY) 1427  PT Time Calculation (min) (ACUTE ONLY) 22 min  PT General Charges  $$ ACUTE PT VISIT 1 Visit  PT Treatments  $Gait Training 8-22 mins   06/01/2017  [place], PT [phone]  (pager)

## 2017-06-01 NOTE — Progress Notes (Signed)
  Speech Language Pathology Treatment: Dysphagia;Cognitive-Linquistic  Patient Details Name: [name] MRN: [id number] DOB: [birth year] Today's Date: 06/01/2017 Time: 4098-1191 SLP Time Calculation (min) (ACUTE ONLY): 18 min  Assessment / Plan / Recommendation Clinical Impression  Pt's granddaughter was feeding him breakfast upon SLP arrival. She reports that he was not able to consume the chopped foods on his dinner tray last night, and that he was gagging and expectorating even the scrambled eggs this morning. At baseline she says he eats mostly soft or pureed foods given his lack of dentures. He consumed his pureed foods with no obvious oral difficulty; delayed throat clearing started after PO intake finished and pt started sliding down in his bed. Suspect potential component versus some pharyngeal residuals. Will downgrade diet to Dys 1 textures, continuing thin liquids. He should remain upright after meals to allow for better clearance.   Cognitively he is still disoriented, needing Max cues for reorientation to location. He needs Mod cues for sustained attention and safety awareness, Min-Mod cues to follow simple commands. His daughter reports that this is very different from his baseline. I think that cognitively he may have the best success if he can return to his home environment, although he would need to have 24/7 supervision in order to do so. Recommend HH SLP if this can be provided - discussed with family present.   HPI HPI: Pt is a 83 y.o.malewith medical history significant ofhypertension and dementia who was brought to the ER by the family for evaluation s/p fall. CT Head showed posttraumatic right frontal subarachnoid hemorrhage, small right frontal acute subdural hematoma representing contrecoup injury, left occipital diastasis, scalp hematoma and slight subcutaneous air due to laceration. CT of the cervical spine showed advanced cervical spondylosis. Pt was transferred to [facility] for closer monitoring but with no surgical intervention indicated at this time.       SLP Plan  Continue with current plan of care       Recommendations  Diet recommendations: Dysphagia 1 (puree);Thin liquid Liquids provided via: Cup;Straw Medication Administration: Crushed with puree Supervision: Staff to assist with self feeding;Full supervision/cueing for compensatory strategies Compensations: Slow rate;Small sips/bites;Follow solids with liquid Postural Changes and/or Swallow Maneuvers: Seated upright 90 degrees;Upright 30-60 min after meal                Oral Care Recommendations: Oral care BID Follow up Recommendations: Home health SLP;24 hour supervision/assistance SLP Visit Diagnosis: Dysphagia, unspecified (R13.10);Cognitive communication deficit (Y78.295) Plan: Continue with current plan of care       GO                Maxcine Ham 06/01/2017, 9:29 AM  Maxcine Ham, M.A. CCC-SLP [phone]

## 2017-06-01 NOTE — Progress Notes (Signed)
PROGRESS NOTE  Minor Iden ZDG:[id number] DOB: [birth year] DOA: 05/30/2017 PCP: [name], MD   LOS: 2 days   Brief Narrative / Interim history: 83 y.o. male with medical history significant of hypertension, dementia was brought to the ER by the family for evaluation of fall. Patient is slightly confused but somewhat is at his baseline per the daughter at bedside.  He was admitted after a ground-level fall and found to have right frontal subarachnoid hemorrhage as well as small right frontal acute subdural hematoma.  Assessment & Plan: Active Problems:   Essential hypertension   Memory loss   Subarachnoid hemorrhage (HCC)   Subarachnoid hemorrhage and subdural hematoma -Neurosurgery consulted, recommended conservative management.  Repeat CT scan of the brain essentially unchanged -Antiseizure prophylaxis for 7 days -Stable from the standpoint -Stopped aspirin  Fever due to suspect underlying aspiration pneumonia -febrile overnight on hospital day 1 as well as again last night, urinalysis without evidence of infection, chest x-ray however with left lower lobe opacity concerning for pneumonia, suspect underlying aspiration in the setting of fall and syncope -Start Unasyn, continue to monitor fever curve.  If he remains stable may be able to go home Augmentin tomorrow  Low back pain -Lumbar pelvic x-ray without acute findings  Hypertension -Continue home medications, blood pressure still poorly controlled of 185/83, he is asymptomatic, I will add Norvasc  Dementia -Supportive treatment, continue Aricept -Sundowning a little bit more overnight and more confused per family, suspect hospital induced delirium     DVT prophylaxis: SCD Code Status: Full code Family Communication: Discussed with daughter at bedside Disposition Plan: home 1 day, PT evaluation pending  Consultants:   Neurosurgery   Procedures:   None   Antimicrobials:  Unasyn 10/4  >>  Subjective: -Pleasant, however appears confused  Objective: Vitals:   06/01/17 0430 06/01/17 0645 06/01/17 0758 06/01/17 1234  BP: (!) 173/77 (!) 155/60 (!) 161/81 (!) 185/83  Pulse: 93 89 80 (!) 102  Resp: 20 20    Temp: 98.4 F (36.9 C) 98.5 F (36.9 C) 99.5 F (37.5 C) 100.2 F (37.9 C)  TempSrc: Oral Oral Oral Oral  SpO2: 99% 98% 98% 97%  Weight:      Height:        Intake/Output Summary (Last 24 hours) at 06/01/17 1409 Last data filed at 06/01/17 0645  Gross per 24 hour  Intake          2151.67 ml  Output             1100 ml  Net          1051.67 ml   Filed Weights   05/30/17 1147  Weight: 79.8 kg (176 lb)    Examination:  Constitutional: No apparent distress Eyes: No scleral icterus Respiratory: Clear to auscultation, no wheezing, no crackles.  Normal respiratory effort Cardiovascular: Regular rate and rhythm, no murmurs.  No lower extremity edema.  Good peripheral pulses. Abdomen: No tenderness, positive bowel sounds Neurologic: Nonfocal, moves all 4, strength equal  Data Reviewed: I have independently reviewed following labs and imaging studies   CBC:  Recent Labs Lab 05/30/17 1340 05/31/17 0219 05/31/17 0731 06/01/17 0500  WBC 6.1 8.0 8.1 6.8  NEUTROABS 5.3  --   --   --   HGB 13.9 12.1* 12.3* 13.0  HCT 42.1 35.7* 37.0* 38.2*  MCV 94.6 91.8 91.8 91.8  PLT 171 143* 150 130*   Basic Metabolic Panel:  Recent Labs Lab 05/30/17 1340 05/31/17 0219 05/31/17  4098 06/01/17 0500  NA 136 135 135 133*  K 3.6 3.4* 3.4* 3.5  CL 99* 99* 100* 96*  CO2 GLUCOSE 133* 125* 116* 109*  BUN 21* CREATININE 1.20 1.16 1.23 1.13  CALCIUM 9.5 9.1 8.8* 8.7*   GFR: Estimated Creatinine Clearance: 45.4 mL/min (by C-G formula based on SCr of 1.13 mg/dL). Liver Function Tests:  Recent Labs Lab 05/30/17 1340  AST 29  ALT 22  ALKPHOS 77  BILITOT 0.7  PROT 7.5  ALBUMIN 4.4   No results for input(s): LIPASE, AMYLASE in the last  168 hours. No results for input(s): AMMONIA in the last 168 hours. Coagulation Profile: No results for input(s): INR, PROTIME in the last 168 hours. Cardiac Enzymes:  Recent Labs Lab 05/30/17 1340 05/30/17 2154 05/31/17 0219 05/31/17 0731  TROPONINI 0.17* 0.25* 0.35* 0.42*   BNP (last 3 results) No results for input(s): PROBNP in the last 8760 hours. HbA1C: No results for input(s): HGBA1C in the last 72 hours. CBG: No results for input(s): GLUCAP in the last 168 hours. Lipid Profile: No results for input(s): CHOL, HDL, LDLCALC, TRIG, CHOLHDL, LDLDIRECT in the last 72 hours. Thyroid Function Tests:  Recent Labs  05/31/17 0219  TSH 0.433   Anemia Panel: No results for input(s): VITAMINB12, FOLATE, FERRITIN, TIBC, IRON, RETICCTPCT in the last 72 hours. Urine analysis:    Component Value Date/Time   COLORURINE YELLOW 05/31/2017 1125   APPEARANCEUR CLEAR 05/31/2017 1125   LABSPEC 1.018 05/31/2017 1125   PHURINE 5.0 05/31/2017 1125   GLUCOSEU NEGATIVE 05/31/2017 1125   HGBUR MODERATE (A) 05/31/2017 1125   HGBUR small 10/22/2008 0859   BILIRUBINUR NEGATIVE 05/31/2017 1125   KETONESUR 5 (A) 05/31/2017 1125   PROTEINUR 100 (A) 05/31/2017 1125   UROBILINOGEN 0.2 10/22/2008 0859   NITRITE NEGATIVE 05/31/2017 1125   LEUKOCYTESUR NEGATIVE 05/31/2017 1125   Sepsis Labs: Invalid input(s): PROCALCITONIN, LACTICIDVEN  Recent Results (from the past 240 hour(s))  MRSA PCR Screening     Status: None   Collection Time: 05/30/17  9:08 PM  Result Value Ref Range Status   MRSA by PCR NEGATIVE NEGATIVE Final    Comment:        The GeneXpert MRSA Assay (FDA approved for NASAL specimens only), is one component of a comprehensive MRSA colonization surveillance program. It is not intended to diagnose MRSA infection nor to guide or monitor treatment for MRSA infections.       Radiology Studies: Ct Head Wo Contrast  Result Date: 05/31/2017 CLINICAL DATA:  Followup head CT  for bleed. EXAM: CT HEAD WITHOUT CONTRAST TECHNIQUE: Contiguous axial images were obtained from the base of the skull through the vertex without intravenous contrast. COMPARISON:  05/30/2017 and 11/08/2010 FINDINGS: Brain: Examination demonstrates evidence of patient's acute subarachnoid hemorrhage over the right frontal region without significant change. Tiny acute right subdural hematoma over the right frontal region without significant change. Possible tiny focus of acute subarachnoid hemorrhage over the left posterior temporal region without significant change. Small amount of intraventricular hemorrhage over the occipital horns bilaterally which is new. No evidence of significant mass effect or midline shift. Vascular: No hyperdense vessel or unexpected calcification. Skull: Persistence of diastases of the left lambdoid suture with adjacent focal soft tissue swelling over the posterior parietal scalp and minimal air within the adjacent soft tissues and inner table of the skull. Sinuses/Orbits: Persistent opacification over the left mastoid air cells likely hemorrhagic debris. Orbits and  remaining sinuses are normal. Other: None. IMPRESSION: Stable acute subarachnoid hemorrhage over the right frontal region with stable tiny right frontal acute subdural hematoma. Possible tiny amount of stable acute subarachnoid hemorrhage over the left posterior temporal region. Small amount of acute intraventricular hemorrhage over the occipital horns which is new. Stable diastases of the left lambdoid suture with associated soft tissue swelling of the left posterior parietal scalp. Hemorrhagic debris over the adjacent left mastoid sinus unchanged. Electronically Signed   By: Elberta Fortis M.D.   On: 05/31/2017 02:25   Ct Cervical Spine Wo Contrast  Result Date: 05/30/2017 CLINICAL DATA:  Larey Seat.  Possible loss of consciousness. EXAM: CT CERVICAL SPINE WITHOUT CONTRAST TECHNIQUE: Multidetector CT imaging of the cervical spine  was performed without intravenous contrast. Multiplanar CT image reconstructions were also generated. COMPARISON:  CT head reported separately. FINDINGS: Alignment: Straightening of the normal cervical lordosis. No traumatic subluxation. Skull base and vertebrae: No fracture or worrisome osseous lesion. Soft tissues and spinal canal: No prevertebral fluid or swelling. No visible canal hematoma. Disc levels: Severe disc space narrowing from C2-3 through C7-T1, with varying degrees of osseous spurring. Upper chest: Severe emphysematous change. No pneumothorax or visible upper rib fracture. Other: Nuchal ligament calcification.  Carotid atherosclerosis. IMPRESSION: Advanced cervical spondylosis. No cervical spine fracture or traumatic subluxation. Electronically Signed   By: Elsie Stain M.D.   On: 05/30/2017 15:02   Dg Lumbar Spine 1 View  Result Date: 05/30/2017 CLINICAL DATA:  Low back pain EXAM: LUMBAR SPINE - 1 VIEW COMPARISON:  Lumbar spine radiograph 07/04/2016 FINDINGS: No acute fracture or listhesis. There is severe disc space loss at all levels, greatest at L4-5 and L5-S1. This is unchanged compared to 07/04/2016. Multilevel severe facet arthrosis is also unchanged. IMPRESSION: Severe degenerative disc and facet disease, unchanged. No compression fracture or static subluxation in the AP direction. Electronically Signed   By: Deatra Robinson M.D.   On: 05/30/2017 18:07   Dg Pelvis Portable  Result Date: 05/30/2017 CLINICAL DATA:  Low back pain.  Fall. EXAM: PORTABLE PELVIS 1-2 VIEWS COMPARISON:  None. FINDINGS: There is complete loss of the joint space of the left hip with associated subchondral mixed sclerotic and lucent change. There is moderate right hip joint space narrowing. No acute fracture or dislocation. IMPRESSION: Mild-to-moderate right and severe left hip osteoarthrosis. Electronically Signed   By: Deatra Robinson M.D.   On: 05/30/2017 18:16   Dg Chest Port 1 View  Result Date:  06/01/2017 CLINICAL DATA:  Fever EXAM: PORTABLE CHEST 1 VIEW COMPARISON:  09/25/2016 FINDINGS: Mild patchy left lower lobe opacity, atelectasis versus pneumonia. Mild patchy right basilar opacity, likely atelectasis. No pleural effusion or pneumothorax. Mild cardiomegaly. IMPRESSION: Mild patchy left lower lobe opacity, atelectasis versus pneumonia. Electronically Signed   By: Charline Bills M.D.   On: 06/01/2017 07:33   Dg Shoulder Left  Result Date: 05/30/2017 CLINICAL DATA:  Left shoulder pain after fall. EXAM: LEFT SHOULDER - 2+ VIEW COMPARISON:  Single-view left shoulder from same day. FINDINGS: No acute fracture or malalignment. Mild acromioclavicular degenerative changes. No focal bony abnormality. IMPRESSION: Mild acromioclavicular degenerative changes. No acute osseous abnormality. Electronically Signed   By: Obie Dredge M.D.   On: 05/30/2017 17:17     Scheduled Meds: . cholecalciferol  5,000 Units Oral Daily  . docusate  100 mg Oral BID  . donepezil  10 mg Oral QHS  . losartan  100 mg Oral Daily   And  . hydrochlorothiazide  25  mg Oral Daily  . levETIRAcetam  500 mg Oral BID  . multivitamin with minerals  1 tablet Oral Daily  . pantoprazole sodium  40 mg Oral Daily   Or  . pantoprazole  40 mg Oral Daily  . senna   Oral QODAY   Continuous Infusions: . sodium chloride 100 mL/hr at 06/01/17 0300  . ampicillin-sulbactam (UNASYN) IV      Pamella Pert, MD, PhD [facility] Pager [phone]  If 7PM-7AM, please contact night-coverage [email or web address] Password [email or web address] 06/01/2017, 2:09 PM

## 2017-06-01 NOTE — Progress Notes (Signed)
CSW submitted clinicals to [facility] for SNF. [facility] may not have beds tomorrow, but [facility] can accept the patient if not.   Osborne Casco Deundra Furber LCSWA [phone]

## 2017-06-01 NOTE — Progress Notes (Signed)
Physical Therapy Treatment Patient Details Name: [name] MRN: [id number] DOB: January 30, 1924 Today's Date: 06/01/2017    History of Present Illness 83 y.o.malewith medical history significant ofhypertension, dementia admitted after a ground-level fall and found to have right frontal subarachnoid hemorrhage as well as small right frontal acute subdural hematoma.    PT Comments    Pt participative, but needing maximal directional cues and assist to stay on focus with ADL portions of the treatment including moderate amounts of peri care.    Follow Up Recommendations  SNF     Equipment Recommendations  None recommended by PT    Recommendations for Other Services       Precautions / Restrictions Precautions Precautions: Fall Precaution Comments: perseverative and impulsive, minimal focus    Mobility  Bed Mobility Overal bed mobility: Needs Assistance Bed Mobility: Supine to Sit;Sit to Supine     Supine to sit: Min assist Sit to supine: Min guard   General bed mobility comments: directional cues and assist  Transfers Overall transfer level: Needs assistance   Transfers: Sit to/from Stand Sit to Stand: Mod assist         General transfer comment: directional cues and assist  Ambulation/Gait             General Gait Details: Initial time spent in pericare and hygiene, unable to get to ambulation.  Also taking meds.   Stairs            Wheelchair Mobility    Modified Rankin (Stroke Patients Only)       Balance Overall balance assessment: Needs assistance   Sitting balance-Leahy Scale: Fair       Standing balance-Leahy Scale: Poor Standing balance comment: spent time in standing and EOB in pericare and donning briefs due to incontinence of urine and stool.                            Cognition Arousal/Alertness: Awake/alert;Lethargic Behavior During Therapy: Restless;Impulsive Overall Cognitive Status: Impaired/Different from  baseline Area of Impairment: Orientation;Attention;Memory;Following commands;Safety/judgement;Awareness;Problem solving                 Orientation Level: Disoriented to;Place;Time;Situation Current Attention Level: Sustained Memory: Decreased short-term memory Following Commands: Follows one step commands inconsistently Safety/Judgement: Decreased awareness of safety;Decreased awareness of deficits Awareness: Emergent;Intellectual Problem Solving: Slow processing;Decreased initiation;Difficulty sequencing;Requires verbal cues        Exercises      General Comments        Pertinent Vitals/Pain Pain Assessment: Faces Faces Pain Scale: No hurt    Home Living                      Prior Function            PT Goals (current goals can now be found in the care plan section) Acute Rehab PT Goals Patient Stated Goal: to get back home PT Goal Formulation: Patient unable to participate in goal setting Time For Goal Achievement: 06/07/17 Potential to Achieve Goals: Good Progress towards PT goals: Progressing toward goals    Frequency    Min 3X/week      PT Plan Discharge plan needs to be updated    Co-evaluation              AM-PAC PT "6 Clicks" Daily Activity  Outcome Measure  Difficulty turning over in bed (including adjusting bedclothes, sheets and blankets)?: A Lot Difficulty moving from lying on  back to sitting on the side of the bed? : A Lot Difficulty sitting down on and standing up from a chair with arms (e.g., wheelchair, bedside commode, etc,.)?: Unable Help needed moving to and from a bed to chair (including a wheelchair)?: A Little Help needed walking in hospital room?: A Lot Help needed climbing 3-5 steps with a railing? : A Lot 6 Click Score: 12    End of Session   Activity Tolerance: Patient limited by fatigue;Patient tolerated treatment well Patient left: in bed;with nursing/sitter in room (at EOB while nursing finished  meds) Nurse Communication: Mobility status PT Visit Diagnosis: Unsteadiness on feet (R26.81);Muscle weakness (generalized) (M62.81);Other abnormalities of gait and mobility (R26.89)     Time: 1610-9604 PT Time Calculation (min) (ACUTE ONLY): 20 min  Charges:  $Therapeutic Activity: 8-22 mins                    G Codes:       06-14-17  [place], PT [phone]  (pager)   Eliseo Gum Arsenia Goracke 06-14-2017, 3:04 PM

## 2017-06-01 NOTE — Progress Notes (Signed)
OT Cancellation Note  Patient Details Name: [name] MRN: [id number] DOB: 1924-01-20   Cancelled Treatment:    Reason Eval/Treat Not Completed: Fatigue/lethargy limiting ability to participate; Pt sleeping upon entering room, able to arouse however Pt with difficulty remaining awake; Pt's family present and reporting Pt has been up walking with PT and to bathroom, just recently got back to bed, politely request OT to check back later. Will follow up as schedule permits.   Marcy Siren, OT Pager [phone] 06/01/2017   [name] 06/01/2017, 2:50 PM

## 2017-06-01 NOTE — [place] FL2 (Signed)
[place] MEDICAID FL2 LEVEL OF CARE SCREENING TOOL     IDENTIFICATION  Patient Name: [name] Birthdate: 10-23-1923 Sex: male Admission Date (Current Location): 05/30/2017  [facility] and [place] Number:  [employer] and Address:  [facility] [place], [address] [place], [place] [address]      Provider Number: [id number]  Attending Physician Name and Address:  Leatha Gilding, MD  Relative Name and Phone Number:  [name] daughter, [phone]    Current Level of Care: Hospital Recommended Level of Care: Skilled Nursing Facility Prior Approval Number:    Date Approved/Denied:   PASRR Number: [id number]  Discharge Plan: SNF    Current Diagnoses: Patient Active Problem List   Diagnosis Date Noted  . Subarachnoid hemorrhage (HCC) 05/30/2017  . LBBB (left bundle branch block) 09/25/2016  . Syncope and collapse 09/25/2016  . Syncope 09/25/2016  . 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 BACK PAIN, LUMBAR, WITH RADICULOPATHY 03/27/2008  . Memory loss 03/27/2008  . ALLERGIC RHINITIS 12/26/2007  . ERECTILE DYSFUNCTION 08/31/2007  . CONSTIPATION 03/22/2007  . ALLERGIC RHINITIS, SEASONAL 01/04/2007  . DEGENERATIVE JOINT DISEASE 12/27/2006  . BENIGN PROSTATIC HYPERTROPHY, WITH OBSTRUCTION 11/14/2006  . HYPERLIPIDEMIA 07/24/2006  . Essential hypertension 07/24/2006  . VENTRICULAR HYPERTROPHY, LEFT 07/24/2006  . RAYNAUD'S DISEASE 07/24/2006  . VENOUS INSUFFICIENCY 07/24/2006  . COLONIC POLYPS, HX OF 07/24/2006    Orientation RESPIRATION BLADDER Height & Weight     Self  Normal Incontinent, External catheter Weight: 79.8 kg (176 lb) Height:   (180.3 cm)  BEHAVIORAL SYMPTOMS/MOOD NEUROLOGICAL BOWEL NUTRITION STATUS      Incontinent Diet (Please see DC Summary)  AMBULATORY STATUS COMMUNICATION OF NEEDS Skin   Limited Assist Verbally Normal                       Personal Care Assistance Level of Assistance  Bathing,  Feeding, Dressing Bathing Assistance: Maximum assistance Feeding assistance: Limited assistance Dressing Assistance: Limited assistance     Functional Limitations Info             SPECIAL CARE FACTORS FREQUENCY  PT (By licensed PT), OT (By licensed OT)     PT Frequency: 5x/week OT Frequency: 3x/week            Contractures      Additional Factors Info  Code Status, Allergies Code Status Info: Full Allergies Info: NKA           Current Medications (06/01/2017):  This is the current hospital active medication list Current Facility-Administered Medications  Medication Dose Route Frequency Provider Last Rate Last Dose  . 0.9 %  sodium chloride infusion   Intravenous Continuous Amin, Ankit Chirag, MD 100 mL/hr at 06/01/17 0300    . acetaminophen (TYLENOL) solution 650 mg  650 mg Oral Q4H PRN Scarlett Presto, [facility]       Or  . acetaminophen (TYLENOL) tablet 650 mg  650 mg Oral Q4H PRN Scarlett Presto, [facility]       Or  . acetaminophen (TYLENOL) suppository 650 mg  650 mg Rectal Q4H PRN Scarlett Presto, RPH      . amLODipine (NORVASC) tablet 10 mg  10 mg Oral Daily Leatha Gilding, MD   10 mg at 06/01/17 1426  . ampicillin-sulbactam (UNASYN) 1.5 g in sodium chloride 0.9 % 50 mL IVPB  1.5 g Intravenous Q8H Gherghe, Daylene Katayama, MD      . cholecalciferol (VITAMIN D) tablet  5,000 Units  5,000 Units Oral Daily Dimple Nanas, MD   5,000 Units at 06/01/17 1006  . docusate (COLACE) 50 MG/5ML liquid 100 mg  100 mg Oral BID Scarlett Presto, RPH   100 mg at 06/01/17 1042  . donepezil (ARICEPT) tablet 10 mg  10 mg Oral QHS Amin, Ankit Chirag, MD   10 mg at 05/31/17 2142  . hydrALAZINE (APRESOLINE) injection 10 mg  10 mg Intravenous Q4H PRN Amin, Ankit Chirag, MD   10 mg at 06/01/17 0825  . losartan (COZAAR) tablet 100 mg  100 mg Oral Daily Amin, Ankit Chirag, MD   100 mg at 06/01/17 1009   And  . hydrochlorothiazide (HYDRODIURIL) tablet 25 mg  25 mg Oral Daily Amin, Ankit Chirag, MD   25  mg at 06/01/17 1008  . levETIRAcetam (KEPPRA) 100 MG/ML solution 500 mg  500 mg Oral BID Pamella Pert M, MD      . LORazepam (ATIVAN) tablet 0.5 mg  0.5 mg Oral Daily PRN Dimple Nanas, MD   0.5 mg at 05/31/17 1602  . multivitamin with minerals tablet 1 tablet  1 tablet Oral Daily Amin, Loura Halt, MD   1 tablet at 06/01/17 1009  . ondansetron (ZOFRAN-ODT) disintegrating tablet 4 mg  4 mg Oral Q6H PRN Amin, Ankit Chirag, MD       Or  . ondansetron (ZOFRAN) injection 4 mg  4 mg Intravenous Q6H PRN Amin, Ankit Chirag, MD      . pantoprazole sodium (PROTONIX) 40 mg/20 mL oral suspension 40 mg  40 mg Oral Daily Scarlett Presto, RPH   40 mg at 06/01/17 1153   Or  . pantoprazole (PROTONIX) EC tablet 40 mg  40 mg Oral Daily Scarlett Presto, [facility]      . senna (SENOKOT) tablet   Oral QODAY Amin, Ankit Chirag, MD   8.6 mg at 05/31/17 1052     Discharge Medications: Please see discharge summary for a list of discharge medications.  Relevant Imaging Results:  Relevant Lab Results:   Additional Information SSN: [id number] [place], [place]

## 2017-06-01 NOTE — Progress Notes (Addendum)
Pharmacy Antibiotic Note  [name] is a 83 y.o. male admitted on 05/30/2017 with aspiration pneumonia.  Pharmacy has been consulted for Unasyn dosing.  Plan: Unasyn 1.5 mg IV every 8 hours Monitor renal function, clinical progress, cultures, length of treatment  Height:  (180.3 cm) Weight: 176 lb (79.8 kg) IBW/kg (Calculated) : 75.3  Temp (24hrs), Avg:99.4 F (37.4 C), Min:98.4 F (36.9 C), Max:101.2 F (38.4 C)   Recent Labs Lab 05/30/17 1340 05/31/17 0219 05/31/17 0731 06/01/17 0500  WBC 6.1 8.0 8.1 6.8  CREATININE 1.20 1.16 1.23 1.13    Estimated Creatinine Clearance: 45.4 mL/min (by C-G formula based on SCr of 1.13 mg/dL).    No Known Allergies  Antimicrobials this admission: 10/4 Unasyn >>   Microbiology results: 10/2 MRSA PCR: negative   Thank you for allowing [place] to participate in this patients care.  Signe Colt, PharmD Clinical phone for 06/01/2017 from 7a-3:30p: [phone] If after 3:30p, please call main pharmacy at: [phone] 06/01/2017 8:56 AM

## 2017-06-01 NOTE — Care Management Note (Signed)
Case Management Note  Patient Details  Name: [name] MRN: [id number] Date of Birth: 1924/05/22  Subjective/Objective:            Admitted s/p fall, suffered a R frontal subarachnoid hemorrhage/ R frontal acute subdural hematoma,  Hx ofhypertension, dementia. From home with wife    [name] (Daughter) [name] (Daughter)    [phone]     PCP: [facility]       Action/Plan: Plan is to d/cto home when medically stable. Per PT/OT evaluations: home health PT /OT. Pt would like home services@ d/c. CM has made referral , after choice was given to daughter, with [facility] pending MD's orders.  Expected Discharge Date:                  Expected Discharge Plan:  Home w Home Health Services (resides with wife)  In-House Referral:     Discharge planning Services  CM Consult  Post Acute Care Choice:    Choice offered to:  Adult Children  DME Arranged:    DME Agency:     HH Arranged:  PT, OT, Nurse's Aide HH Agency:  [facility]  Status of Service:  completed  If discussed at Long Length of Stay Meetings, dates discussed:    Additional Comments:  Epifanio Lesches, RN 06/01/2017, 11:50 AM

## 2017-06-01 NOTE — Clinical Social Work Note (Signed)
Clinical Social Work Assessment  Patient Details  Name: [name] MRN: [id number] Date of Birth: 09/27/1923  Date of referral:  06/01/17               Reason for consult:  Facility Placement                Permission sought to share information with:  Facility Medical sales representative, Family Supports Permission granted to share information::  Yes, Verbal Permission Granted  Name::     Architectural technologist::  SNFs  Relationship::  daughter  Contact Information:  [phone]  Housing/Transportation Living arrangements for the past 2 months:  Single Family Home Source of Information:  Adult Children Patient Interpreter Needed:  None Criminal Activity/Legal Involvement Pertinent to Current Situation/Hospitalization:  No - Comment as needed Significant Relationships:  Adult Children Lives with:  Self Do you feel safe going back to the place where you live?  No Need for family participation in patient care:  Yes (Comment)  Care giving concerns:  CSW received consult for possible SNF placement at time of discharge. CSW spoke with patient's daughter, [name], regarding PT recommendation of SNF placement at time of discharge. Patient's daughter reported that patient is currently unable to care for patient at home given patient's current physical needs and fall risk. Patient's daughter expressed understanding of PT recommendation and is agreeable to SNF placement at time of discharge. CSW to continue to follow and assist with discharge planning needs.   Social Worker assessment / plan:  CSW spoke with patient's daughter concerning possibility of rehab at [facility] before returning home.  Employment status:  Retired Database administrator PT Recommendations:  Skilled Nursing Facility Information / Referral to community resources:  Skilled Nursing Facility  Patient/Family's Response to care:  Patient's daughter recognizes need for rehab before returning home and is agreeable to a SNF in  [place]. Patient's daughter reported preference for [facility] first and [facility] second. CSW explained that patient will have to be approved by insurance before patient can go to SNF.   Patient/Family's Understanding of and Emotional Response to Diagnosis, Current Treatment, and Prognosis:  Patient/family is realistic regarding therapy needs and expressed being hopeful for SNF placement. Patient's daughter expressed understanding of CSW role and discharge process as well as medical condition. She states patient is not at baseline and hopes he can return to it. No questions/concerns about plan or treatment.    Emotional Assessment Appearance:  Appears stated age Attitude/Demeanor/Rapport:  Unable to Assess Affect (typically observed):  Unable to Assess Orientation:  Oriented to Self Alcohol / Substance use:  Not Applicable Psych involvement (Current and /or in the community):  No (Comment)  Discharge Needs  Concerns to be addressed:  Care Coordination Readmission within the last 30 days:  No Current discharge risk:  None Barriers to Discharge:  Continued Medical Work up   [employer], LCSWA 06/01/2017, 5:01 PM

## 2017-06-02 DIAGNOSIS — J690 Pneumonitis due to inhalation of food and vomit: Secondary | ICD-10-CM

## 2017-06-02 DIAGNOSIS — N179 Acute kidney failure, unspecified: Secondary | ICD-10-CM

## 2017-06-02 DIAGNOSIS — N182 Chronic kidney disease, stage 2 (mild): Secondary | ICD-10-CM

## 2017-06-02 LAB — BASIC METABOLIC PANEL
Anion gap: 9 (ref 5–15)
BUN: 18 mg/dL (ref 6–20)
CALCIUM: 8.7 mg/dL — AB (ref 8.9–10.3)
CO2: 27 mmol/L (ref 22–32)
CREATININE: 1.84 mg/dL — AB (ref 0.61–1.24)
Chloride: 96 mmol/L — ABNORMAL LOW (ref 101–111)
GFR calc Af Amer: 35 mL/min — ABNORMAL LOW (ref >60)
GFR calc non Af Amer: 30 mL/min — ABNORMAL LOW (ref >60)
GLUCOSE: 135 mg/dL — AB (ref 65–99)
Potassium: 3.1 mmol/L — ABNORMAL LOW (ref 3.5–5.1)
Sodium: 132 mmol/L — ABNORMAL LOW (ref 135–145)

## 2017-06-02 LAB — CBC
HEMATOCRIT: 37.5 % — AB (ref 39.0–52.0)
HEMOGLOBIN: 12.7 g/dL — AB (ref 13.0–17.0)
MCH: 31.1 pg (ref 26.0–34.0)
MCHC: 33.9 g/dL (ref 30.0–36.0)
MCV: 91.7 fL (ref 78.0–100.0)
PLATELETS: 141 10*3/uL — AB (ref 150–400)
RBC: 4.09 MIL/uL — AB (ref 4.22–5.81)
RDW: 12.6 % (ref 11.5–15.5)
WBC: 11 10*3/uL — ABNORMAL HIGH (ref 4.0–10.5)

## 2017-06-02 MED ORDER — SODIUM CHLORIDE 0.9 % IV SOLN
1.5000 g | Freq: Two times a day (BID) | INTRAVENOUS | Status: DC
  Administered 2017-06-02 – 2017-06-06 (×8): 1.5 g via INTRAVENOUS
  Filled 2017-06-02 (×8): qty 1.5

## 2017-06-02 MED ORDER — HYDRALAZINE HCL 10 MG PO TABS
10.0000 mg | ORAL_TABLET | Freq: Three times a day (TID) | ORAL | Status: DC
  Administered 2017-06-02 – 2017-06-06 (×12): 10 mg via ORAL
  Filled 2017-06-02 (×12): qty 1

## 2017-06-02 MED ORDER — POTASSIUM CHLORIDE CRYS ER 20 MEQ PO TBCR
40.0000 meq | EXTENDED_RELEASE_TABLET | Freq: Two times a day (BID) | ORAL | Status: AC
  Administered 2017-06-02 (×2): 40 meq via ORAL
  Filled 2017-06-02 (×2): qty 2

## 2017-06-02 NOTE — Progress Notes (Signed)
Patient received [employer] authorization: [phone] for 10/6 good through Monday. Patient is able to discharge to [facility] this weekend. They request discharge summary by 11am on day of discharge so they can get meds. Patient's daughter, [name], aware.  Osborne Casco Dvonte Gatliff LCSWA [phone]

## 2017-06-02 NOTE — Progress Notes (Signed)
Occupational Therapy Treatment Patient Details Name: Zevin Nevares MRN: [id number] DOB: [birth year] Today's Date: 06/02/2017    History of present illness 83 y.o.malewith medical history significant ofhypertension, dementia admitted after a ground-level fall and found to have right frontal subarachnoid hemorrhage as well as small right frontal acute subdural hematoma.   OT comments  Pt progressing towards acute OT goals. Focus of session was sitting and standing balance/tolerance. Spouse and daughter present. Session details below. D/c plan remains appropriate.    Follow Up Recommendations  Supervision/Assistance - 24 hour    Equipment Recommendations  Tub/shower bench    Recommendations for Other Services      Precautions / Restrictions Precautions Precautions: Fall Precaution Comments: perseverative and impulsive, minimal focus Restrictions Weight Bearing Restrictions: No       Mobility Bed Mobility Overal bed mobility: Needs Assistance Bed Mobility: Supine to Sit;Sit to Supine     Supine to sit: Mod assist;Min assist Sit to supine: Min guard   General bed mobility comments: directional cues and assist; improved performance on 2nd supine>EOB.  Transfers Overall transfer level: Needs assistance Equipment used: 1 person hand held assist Transfers: Sit to/from Stand Sit to Stand: Min assist;Mod assist         General transfer comment: directional cues and assist.  steadying assist upon standing.     Balance Overall balance assessment: Needs assistance Sitting-balance support: No upper extremity supported;Single extremity supported;Bilateral upper extremity supported Sitting balance-Leahy Scale: Fair     Standing balance support: Single extremity supported Standing balance-Leahy Scale: Poor                             ADL either performed or assessed with clinical judgement   ADL Overall ADL's : Needs assistance/impaired                              Toileting- Clothing Manipulation and Hygiene: Moderate assistance Toileting - Clothing Manipulation Details (indicate cue type and reason): Pt able to use washcloth for anterior pericare in seated position. Assist to guard with pt leaning backwards to complete       General ADL Comments: Bed mobility, sat EOB about 2 minutes 2x with 1 rest break in supine. stood EOB for <1 minute then fatigued.      Vision       Perception     Praxis      Cognition Arousal/Alertness: Awake/alert Behavior During Therapy: Restless;Impulsive Overall Cognitive Status: Impaired/Different from baseline Area of Impairment: Orientation;Attention;Memory;Following commands;Safety/judgement;Awareness;Problem solving                 Orientation Level: Disoriented to;Place;Time;Situation Current Attention Level: Sustained Memory: Decreased short-term memory Following Commands: Follows one step commands inconsistently Safety/Judgement: Decreased awareness of safety;Decreased awareness of deficits Awareness: Emergent;Intellectual Problem Solving: Slow processing;Decreased initiation;Difficulty sequencing;Requires verbal cues General Comments: Pt's daughter reports pt seems less agitated/anxious today compared to yesterday.         Exercises     Shoulder Instructions       General Comments      Pertinent Vitals/ Pain       Faces Pain Scale: Hurts little more Pain Location: lower back Pain Descriptors / Indicators: Aching Pain Intervention(s): Monitored during session;Repositioned  Home Living  Prior Functioning/Environment              Frequency  Min 2X/week        Progress Toward Goals  OT Goals(current goals can now be found in the care plan section)  Progress towards OT goals: Progressing toward goals  Acute Rehab OT Goals Patient Stated Goal: to get back home OT Goal Formulation: With  patient Time For Goal Achievement: 06/14/17 Potential to Achieve Goals: Good ADL Goals Pt Will Perform Grooming: with supervision;standing Pt Will Perform Upper Body Bathing: with set-up;sitting Pt Will Perform Lower Body Bathing: with supervision;sitting/lateral leans Pt Will Transfer to Toilet: with supervision;ambulating Pt Will Perform Toileting - Clothing Manipulation and hygiene: with supervision;sit to/from stand Pt Will Perform Tub/Shower Transfer: Tub transfer;with min guard assist;with caregiver independent in assisting;tub bench;3 in 1 Additional ADL Goal #1: Pt will demonstrate bed mobility at supervision level as precursor for participation in ADL  Plan Discharge plan remains appropriate    Co-evaluation                 AM-PAC PT "6 Clicks" Daily Activity     Outcome Measure   Help from another person eating meals?: A Little Help from another person taking care of personal grooming?: A Little Help from another person toileting, which includes using toliet, bedpan, or urinal?: A Little Help from another person bathing (including washing, rinsing, drying)?: A Little Help from another person to put on and taking off regular upper body clothing?: A Little Help from another person to put on and taking off regular lower body clothing?: A Lot 6 Click Score: 17    End of Session    OT Visit Diagnosis: Unsteadiness on feet (R26.81);History of falling (Z91.81);Other symptoms and signs involving cognitive function   Activity Tolerance Patient tolerated treatment well   Patient Left in bed;with call bell/phone within reach;with bed alarm set;with nursing/sitter in room;with restraints reapplied;with family/visitor present   Nurse Communication Other (comment) (condom cath off)        Time: 1245-1315 OT Time Calculation (min): 30 min  Charges: OT General Charges $OT Visit: 1 Visit OT Treatments $Self Care/Home Management : 23-37 mins     Pilar Grammes 06/02/2017, 1:31 PM

## 2017-06-02 NOTE — Progress Notes (Addendum)
PROGRESS NOTE  [name] JXB:[id number] DOB: 05-11-1924 DOA: 05/30/2017 PCP: [name], MD   LOS: 3 days   Brief Narrative / Interim history: 83 y.o. male with medical history significant of hypertension, dementia was brought to the ER by the family for evaluation of fall. Patient is slightly confused but somewhat is at his baseline per the daughter at bedside.  He was admitted after a ground-level fall and found to have right frontal subarachnoid hemorrhage as well as small right frontal acute subdural hematoma.  Assessment & Plan: Active Problems:   Essential hypertension   Memory loss   Subarachnoid hemorrhage (HCC)   Aspiration pneumonia (HCC)   AKI (acute kidney injury) (HCC)   CKD (chronic kidney disease), stage II   Subarachnoid hemorrhage and subdural hematoma -Neurosurgery consulted, recommended conservative management.  Repeat CT scan of the brain essentially unchanged -Antiseizure prophylaxis for 7 days -Stable from the standpoint -Stopped aspirin  Fever due to suspect underlying aspiration pneumonia -febrile overnight on hospital day 1 as well as again last night, urinalysis without evidence of infection, chest x-ray however with left lower lobe opacity concerning for pneumonia, suspect underlying aspiration in the setting of fall and syncope -Start Unasyn, continue to monitor fever curve.  If he remains stable may be able to go home Augmentin tomorrow   Low back pain -Lumbar pelvic x-ray without acute findings  Hypertension -Continue home medications, blood pressure still poorly controlled, he is asymptomatic, I added Norvasc, add hydralazine today -Today we will discontinue Cozaar/HCTZ due to AKI  Dementia -Supportive treatment, continue Aricept -Sundowning a little bit more overnight and more confused per family, suspect hospital induced delirium  AKI on CKD II-III -Patient has slight elevation of creatinine throughout 2018, baseline appears to be  between 1.1 and 1.5, today worsening to 1.84, continue IV fluids, closely monitor, per nurse he has been peeing well and had several incontinent episodes, however will do a bladder scan -Hold Cozaar/HCTZ   DVT prophylaxis: SCD Code Status: Full code Family Communication: Discussed with daughter at bedside Disposition Plan: home when kidney function is better  Consultants:   Neurosurgery   Procedures:   None   Antimicrobials:  Unasyn 10/4 >>  Subjective: - no chest pain, shortness of breath, no abdominal pain, nausea or vomiting.   Objective: Vitals:   06/02/17 0540 06/02/17 0925 06/02/17 0937 06/02/17 1220  BP: (!) 142/81  (!) 200/86 (!) 182/80  Pulse: 79   88  Resp: 18   18  Temp: 99.5 F (37.5 C) 99.5 F (37.5 C)  (!) 100.4 F (38 C)  TempSrc: Oral Oral  Oral  SpO2: 98%   96%  Weight:      Height:        Intake/Output Summary (Last 24 hours) at 06/02/17 1354 Last data filed at 06/02/17 1235  Gross per 24 hour  Intake             2495 ml  Output              825 ml  Net             1670 ml   Filed Weights   05/30/17 1147  Weight: 79.8 kg (176 lb)    Examination:  Constitutional: NAD Eyes: No scleral icterus, lids and conjunctivae normal Respiratory: Clear to auscultation bilaterally, no wheezing, no crackles, normal respiratory effort Cardiovascular: Regular rate and rhythm, no murmurs.  No edema. Abdomen: No tenderness, positive bowel sounds Neurologic: No focal findings  Data Reviewed: I have independently reviewed following labs and imaging studies   CBC:  Recent Labs Lab 05/30/17 1340 05/31/17 0219 05/31/17 0731 06/01/17 0500 06/02/17 0546  WBC 6.1 8.0 8.1 6.8 11.0*  NEUTROABS 5.3  --   --   --   --   HGB 13.9 12.1* 12.3* 13.0 12.7*  HCT 42.1 35.7* 37.0* 38.2* 37.5*  MCV 94.6 91.8 91.8 91.8 91.7  PLT 171 143* 150 130* 141*   Basic Metabolic Panel:  Recent Labs Lab 05/30/17 1340 05/31/17 0219 05/31/17 0731 06/01/17 0500  06/02/17 0546  NA 136 135 135 133* 132*  K 3.6 3.4* 3.4* 3.5 3.1*  CL 99* 99* 100* 96* 96*  CO2 GLUCOSE 133* 125* 116* 109* 135*  BUN 21* CREATININE 1.20 1.16 1.23 1.13 1.84*  CALCIUM 9.5 9.1 8.8* 8.7* 8.7*   GFR: Estimated Creatinine Clearance: 27.9 mL/min (A) (by C-G formula based on SCr of 1.84 mg/dL (H)). Liver Function Tests:  Recent Labs Lab 05/30/17 1340  AST 29  ALT 22  ALKPHOS 77  BILITOT 0.7  PROT 7.5  ALBUMIN 4.4   No results for input(s): LIPASE, AMYLASE in the last 168 hours. No results for input(s): AMMONIA in the last 168 hours. Coagulation Profile: No results for input(s): INR, PROTIME in the last 168 hours. Cardiac Enzymes:  Recent Labs Lab 05/30/17 1340 05/30/17 2154 05/31/17 0219 05/31/17 0731  TROPONINI 0.17* 0.25* 0.35* 0.42*   BNP (last 3 results) No results for input(s): PROBNP in the last 8760 hours. HbA1C: No results for input(s): HGBA1C in the last 72 hours. CBG: No results for input(s): GLUCAP in the last 168 hours. Lipid Profile: No results for input(s): CHOL, HDL, LDLCALC, TRIG, CHOLHDL, LDLDIRECT in the last 72 hours. Thyroid Function Tests:  Recent Labs  05/31/17 0219  TSH 0.433   Anemia Panel: No results for input(s): VITAMINB12, FOLATE, FERRITIN, TIBC, IRON, RETICCTPCT in the last 72 hours. Urine analysis:    Component Value Date/Time   COLORURINE YELLOW 05/31/2017 1125   APPEARANCEUR CLEAR 05/31/2017 1125   LABSPEC 1.018 05/31/2017 1125   PHURINE 5.0 05/31/2017 1125   GLUCOSEU NEGATIVE 05/31/2017 1125   HGBUR MODERATE (A) 05/31/2017 1125   HGBUR small 10/22/2008 0859   BILIRUBINUR NEGATIVE 05/31/2017 1125   KETONESUR 5 (A) 05/31/2017 1125   PROTEINUR 100 (A) 05/31/2017 1125   UROBILINOGEN 0.2 10/22/2008 0859   NITRITE NEGATIVE 05/31/2017 1125   LEUKOCYTESUR NEGATIVE 05/31/2017 1125   Sepsis Labs: Invalid input(s): PROCALCITONIN, LACTICIDVEN  Recent Results (from the past 240  hour(s))  MRSA PCR Screening     Status: None   Collection Time: 05/30/17  9:08 PM  Result Value Ref Range Status   MRSA by PCR NEGATIVE NEGATIVE Final    Comment:        The GeneXpert MRSA Assay (FDA approved for NASAL specimens only), is one component of a comprehensive MRSA colonization surveillance program. It is not intended to diagnose MRSA infection nor to guide or monitor treatment for MRSA infections.       Radiology Studies: Dg Chest Port 1 View  Result Date: 06/01/2017 CLINICAL DATA:  Fever EXAM: PORTABLE CHEST 1 VIEW COMPARISON:  09/25/2016 FINDINGS: Mild patchy left lower lobe opacity, atelectasis versus pneumonia. Mild patchy right basilar opacity, likely atelectasis. No pleural effusion or pneumothorax. Mild cardiomegaly. IMPRESSION: Mild patchy left lower lobe opacity, atelectasis versus pneumonia. Electronically Signed   By: Roselie Awkward.D.  On: 06/01/2017 07:33     Scheduled Meds: . amLODipine  10 mg Oral Daily  . cholecalciferol  5,000 Units Oral Daily  . docusate  100 mg Oral BID  . donepezil  10 mg Oral QHS  . feeding supplement (ENSURE ENLIVE)  237 mL Oral TID BM  . losartan  100 mg Oral Daily   And  . hydrochlorothiazide  25 mg Oral Daily  . levETIRAcetam  500 mg Oral BID  . multivitamin with minerals  1 tablet Oral Daily  . pantoprazole sodium  40 mg Oral Daily   Or  . pantoprazole  40 mg Oral Daily  . potassium chloride  40 mEq Oral BID  . senna   Oral QODAY   Continuous Infusions: . sodium chloride 100 mL/hr at 06/02/17 1323  . ampicillin-sulbactam (UNASYN) IV      Pamella Pert, MD, PhD [facility] Pager [phone]  If 7PM-7AM, please contact night-coverage [email or web address] Password [email or web address] 06/02/2017, 1:54 PM

## 2017-06-03 LAB — CBC
HCT: 39.3 % (ref 39.0–52.0)
HEMOGLOBIN: 13.3 g/dL (ref 13.0–17.0)
MCH: 31 pg (ref 26.0–34.0)
MCHC: 33.8 g/dL (ref 30.0–36.0)
MCV: 91.6 fL (ref 78.0–100.0)
Platelets: 146 10*3/uL — ABNORMAL LOW (ref 150–400)
RBC: 4.29 MIL/uL (ref 4.22–5.81)
RDW: 12.5 % (ref 11.5–15.5)
WBC: 15.4 10*3/uL — ABNORMAL HIGH (ref 4.0–10.5)

## 2017-06-03 LAB — BASIC METABOLIC PANEL
ANION GAP: 10 (ref 5–15)
BUN: 21 mg/dL — ABNORMAL HIGH (ref 6–20)
CHLORIDE: 100 mmol/L — AB (ref 101–111)
CO2: 26 mmol/L (ref 22–32)
Calcium: 9.1 mg/dL (ref 8.9–10.3)
Creatinine, Ser: 1.65 mg/dL — ABNORMAL HIGH (ref 0.61–1.24)
GFR calc non Af Amer: 35 mL/min — ABNORMAL LOW (ref >60)
GFR, EST AFRICAN AMERICAN: 40 mL/min — AB (ref >60)
GLUCOSE: 159 mg/dL — AB (ref 65–99)
Potassium: 3.3 mmol/L — ABNORMAL LOW (ref 3.5–5.1)
Sodium: 136 mmol/L (ref 135–145)

## 2017-06-03 MED ORDER — METOPROLOL TARTRATE 5 MG/5ML IV SOLN
5.0000 mg | Freq: Once | INTRAVENOUS | Status: AC
  Administered 2017-06-03: 5 mg via INTRAVENOUS
  Filled 2017-06-03: qty 5

## 2017-06-03 MED ORDER — CARVEDILOL 6.25 MG PO TABS
6.2500 mg | ORAL_TABLET | Freq: Two times a day (BID) | ORAL | Status: DC
  Administered 2017-06-03 (×2): 6.25 mg via ORAL
  Filled 2017-06-03 (×2): qty 1

## 2017-06-03 NOTE — Social Work (Signed)
CSW reviewed notes. Pt does not appear to be medically stable for DC.   CSW will f/u in the am for disposition to [facility].  Keene Breath, LCSW Clinical Social Worker [phone]

## 2017-06-03 NOTE — Progress Notes (Signed)
PROGRESS NOTE  [name] RUE:[id number] DOB: [birth year] DOA: 05/30/2017 PCP: [name], MD   LOS: 4 days   Brief Narrative / Interim history: 83 y.o. male with medical history significant of hypertension, dementia was brought to the ER by the family for evaluation of fall. Patient is slightly confused but somewhat is at his baseline per the daughter at bedside.  He was admitted after a ground-level fall and found to have right frontal subarachnoid hemorrhage as well as small right frontal acute subdural hematoma.  Assessment & Plan: Active Problems:   Essential hypertension   Memory loss   Subarachnoid hemorrhage (HCC)   Aspiration pneumonia (HCC)   AKI (acute kidney injury) (HCC)   CKD (chronic kidney disease), stage II   Subarachnoid hemorrhage and subdural hematoma -Neurosurgery consulted, recommended conservative management.  Repeat CT scan of the brain essentially unchanged -Antiseizure prophylaxis for 7 days -Stable from the standpoint -Stopped aspirin  Fever due to suspect underlying aspiration pneumonia -febrile overnight on hospital day 1 as well as again last night, urinalysis without evidence of infection, chest x-ray however with left lower lobe opacity concerning for pneumonia, suspect underlying aspiration in the setting of fall and syncope -Continue Unasyn, continue to monitor fever curve  Atrial fibrillation with RVR -Patient overnight appears to have developed A. fib with RVR, -patient's CHA2DS2-VASc Score for Stroke Risk is >3,  -Start Coreg at this morning, not a candidate for anticoagulation due to #1  Low back pain -Lumbar pelvic x-ray without acute findings  Hypertension -Continue home medications, blood pressure still poorly controlled, he is asymptomatic, I added Norvasc, hydralazine, Coreg today -His Hyzaar is now stopped due to AKI  Dementia -Supportive treatment, continue Aricept -Sundowning a little bit more overnight and more confused  per family, suspect hospital induced delirium  AKI on CKD II-III -Patient has slight elevation of creatinine throughout 2018, baseline appears to be between 1.1 and 1.5, worsening to 1.84 on 10/5, he was given IV fluids and improved this morning to 1.6. -Hold Cozaar/HCTZ   DVT prophylaxis: SCD Code Status: Full code Family Communication: Discussed with daughter at bedside Disposition Plan: home when kidney function is better  Consultants:   Neurosurgery   Procedures:   None   Antimicrobials:  Unasyn 10/4 >>  Subjective: - no chest pain, shortness of breath, no abdominal pain, nausea or vomiting.  Demented  Objective: Vitals:   06/03/17 0439 06/03/17 0759 06/03/17 0901 06/03/17 1200  BP: (!) 187/88 (!) 162/92 (!) 176/95 (!) 172/62  Pulse: (!) 104 68 75 (!) 113  Resp: Temp: 99.9 F (37.7 C) 98.7 F (37.1 C)  99.5 F (37.5 C)  TempSrc: Oral Oral  Oral  SpO2: 97% 97%  95%  Weight:      Height:        Intake/Output Summary (Last 24 hours) at 06/03/17 1338 Last data filed at 06/03/17 1478  Gross per 24 hour  Intake          1998.33 ml  Output              850 ml  Net          1148.33 ml   Filed Weights   05/30/17 1147  Weight: 79.8 kg (176 lb)    Examination:  Constitutional: No distress, pleasant but agitated at times Eyes: No scleral icterus Respiratory: Clear to auscultation bilaterally, no crackles, no wheezing Cardiovascular: Irregular, no murmurs, no peripheral edema Abdomen: Nondistended, no tenderness to  palpation, positive bowel sounds Neurologic: No focal findings  Data Reviewed: I have independently reviewed following labs and imaging studies   CBC:  Recent Labs Lab 05/30/17 1340 05/31/17 0219 05/31/17 0731 06/01/17 0500 06/02/17 0546 06/03/17 0403  WBC 6.1 8.0 8.1 6.8 11.0* 15.4*  NEUTROABS 5.3  --   --   --   --   --   HGB 13.9 12.1* 12.3* 13.0 12.7* 13.3  HCT 42.1 35.7* 37.0* 38.2* 37.5* 39.3  MCV 94.6 91.8 91.8 91.8  91.7 91.6  PLT 171 143* 150 130* 141* 146*   Basic Metabolic Panel:  Recent Labs Lab 05/31/17 0219 05/31/17 0731 06/01/17 0500 06/02/17 0546 06/03/17 0403  NA 135 135 133* 132* 136  K 3.4* 3.4* 3.5 3.1* 3.3*  CL 99* 100* 96* 96* 100*  CO2 GLUCOSE 125* 116* 109* 135* 159*  BUN 21*  CREATININE 1.16 1.23 1.13 1.84* 1.65*  CALCIUM 9.1 8.8* 8.7* 8.7* 9.1   GFR: Estimated Creatinine Clearance: 31.1 mL/min (A) (by C-G formula based on SCr of 1.65 mg/dL (H)). Liver Function Tests:  Recent Labs Lab 05/30/17 1340  AST 29  ALT 22  ALKPHOS 77  BILITOT 0.7  PROT 7.5  ALBUMIN 4.4   No results for input(s): LIPASE, AMYLASE in the last 168 hours. No results for input(s): AMMONIA in the last 168 hours. Coagulation Profile: No results for input(s): INR, PROTIME in the last 168 hours. Cardiac Enzymes:  Recent Labs Lab 05/30/17 1340 05/30/17 2154 05/31/17 0219 05/31/17 0731  TROPONINI 0.17* 0.25* 0.35* 0.42*   BNP (last 3 results) No results for input(s): PROBNP in the last 8760 hours. HbA1C: No results for input(s): HGBA1C in the last 72 hours. CBG: No results for input(s): GLUCAP in the last 168 hours. Lipid Profile: No results for input(s): CHOL, HDL, LDLCALC, TRIG, CHOLHDL, LDLDIRECT in the last 72 hours. Thyroid Function Tests: No results for input(s): TSH, T4TOTAL, FREET4, T3FREE, THYROIDAB in the last 72 hours. Anemia Panel: No results for input(s): VITAMINB12, FOLATE, FERRITIN, TIBC, IRON, RETICCTPCT in the last 72 hours. Urine analysis:    Component Value Date/Time   COLORURINE YELLOW 05/31/2017 1125   APPEARANCEUR CLEAR 05/31/2017 1125   LABSPEC 1.018 05/31/2017 1125   PHURINE 5.0 05/31/2017 1125   GLUCOSEU NEGATIVE 05/31/2017 1125   HGBUR MODERATE (A) 05/31/2017 1125   HGBUR small 10/22/2008 0859   BILIRUBINUR NEGATIVE 05/31/2017 1125   KETONESUR 5 (A) 05/31/2017 1125   PROTEINUR 100 (A) 05/31/2017 1125   UROBILINOGEN 0.2  10/22/2008 0859   NITRITE NEGATIVE 05/31/2017 1125   LEUKOCYTESUR NEGATIVE 05/31/2017 1125   Sepsis Labs: Invalid input(s): PROCALCITONIN, LACTICIDVEN  Recent Results (from the past 240 hour(s))  MRSA PCR Screening     Status: None   Collection Time: 05/30/17  9:08 PM  Result Value Ref Range Status   MRSA by PCR NEGATIVE NEGATIVE Final    Comment:        The GeneXpert MRSA Assay (FDA approved for NASAL specimens only), is one component of a comprehensive MRSA colonization surveillance program. It is not intended to diagnose MRSA infection nor to guide or monitor treatment for MRSA infections.       Radiology Studies: No results found.   Scheduled Meds: . amLODipine  10 mg Oral Daily  . carvedilol  6.25 mg Oral BID WC  . cholecalciferol  5,000 Units Oral Daily  . docusate  100 mg Oral BID  . donepezil  10 mg Oral QHS  . feeding supplement (ENSURE ENLIVE)  237 mL Oral TID BM  . hydrALAZINE  10 mg Oral Q8H  . levETIRAcetam  500 mg Oral BID  . multivitamin with minerals  1 tablet Oral Daily  . pantoprazole sodium  40 mg Oral Daily   Or  . pantoprazole  40 mg Oral Daily  . senna   Oral QODAY   Continuous Infusions: . ampicillin-sulbactam (UNASYN) IV Stopped (06/03/17 1610)    Pamella Pert, MD, PhD [facility] Pager [phone]  If 7PM-7AM, please contact night-coverage [email or web address] Password [email or web address] 06/03/2017, 1:38 PM

## 2017-06-03 NOTE — Progress Notes (Signed)
Pt's heart rate slowly increasing throughout this RN's shift. Pt asymptomatic. SBP anywhere from 150 to 190s. HR in the 110-130s, on monitor rhythm is irregular with occasion p waves. EKG obtained showing AFIB with RVR. MD paged and made aware. Order obtained for lopressor  iv x1, given with good affect. HR now 80-100s with occasional P waves. BP resulted at 200/93. Pt medicated with scheduled hydralazine  po. MD aware of situation. WCTM

## 2017-06-04 ENCOUNTER — Inpatient Hospital Stay (HOSPITAL_COMMUNITY): Payer: Medicare HMO

## 2017-06-04 LAB — URINALYSIS, ROUTINE W REFLEX MICROSCOPIC
BACTERIA UA: NONE SEEN
Bilirubin Urine: NEGATIVE
GLUCOSE, UA: NEGATIVE mg/dL
KETONES UR: NEGATIVE mg/dL
NITRITE: NEGATIVE
PROTEIN: 30 mg/dL — AB
Specific Gravity, Urine: 1.023 (ref 1.005–1.030)
pH: 6 (ref 5.0–8.0)

## 2017-06-04 LAB — CBC
HEMATOCRIT: 38.2 % — AB (ref 39.0–52.0)
HEMOGLOBIN: 13.1 g/dL (ref 13.0–17.0)
MCH: 31.4 pg (ref 26.0–34.0)
MCHC: 34.3 g/dL (ref 30.0–36.0)
MCV: 91.6 fL (ref 78.0–100.0)
PLATELETS: 128 10*3/uL — AB (ref 150–400)
RBC: 4.17 MIL/uL — AB (ref 4.22–5.81)
RDW: 12.9 % (ref 11.5–15.5)
WBC: 15.3 10*3/uL — AB (ref 4.0–10.5)

## 2017-06-04 LAB — BASIC METABOLIC PANEL
Anion gap: 13 (ref 5–15)
BUN: 53 mg/dL — ABNORMAL HIGH (ref 6–20)
CALCIUM: 9.3 mg/dL (ref 8.9–10.3)
CHLORIDE: 102 mmol/L (ref 101–111)
CO2: 24 mmol/L (ref 22–32)
CREATININE: 2.96 mg/dL — AB (ref 0.61–1.24)
GFR calc Af Amer: 20 mL/min — ABNORMAL LOW (ref >60)
GFR calc non Af Amer: 17 mL/min — ABNORMAL LOW (ref >60)
GLUCOSE: 158 mg/dL — AB (ref 65–99)
Potassium: 3.7 mmol/L (ref 3.5–5.1)
Sodium: 139 mmol/L (ref 135–145)

## 2017-06-04 LAB — BLOOD GAS, ARTERIAL
Acid-Base Excess: 3.8 mmol/L — ABNORMAL HIGH (ref 0.0–2.0)
BICARBONATE: 26.9 mmol/L (ref 20.0–28.0)
Drawn by: 270211
FIO2: 0.21
O2 SAT: 92.5 %
PATIENT TEMPERATURE: 98.6
PCO2 ART: 34 mmHg (ref 32.0–48.0)
PO2 ART: 66.4 mmHg — AB (ref 83.0–108.0)
pH, Arterial: 7.509 — ABNORMAL HIGH (ref 7.350–7.450)

## 2017-06-04 MED ORDER — SODIUM CHLORIDE 0.9 % IV SOLN
INTRAVENOUS | Status: DC
  Administered 2017-06-04 – 2017-06-05 (×2): via INTRAVENOUS

## 2017-06-04 MED ORDER — LABETALOL HCL 5 MG/ML IV SOLN
10.0000 mg | INTRAVENOUS | Status: DC | PRN
  Administered 2017-06-04: 10 mg via INTRAVENOUS
  Filled 2017-06-04: qty 4

## 2017-06-04 MED ORDER — CARVEDILOL 12.5 MG PO TABS
12.5000 mg | ORAL_TABLET | Freq: Two times a day (BID) | ORAL | Status: DC
  Administered 2017-06-05 – 2017-06-06 (×3): 12.5 mg via ORAL
  Filled 2017-06-04 (×4): qty 1

## 2017-06-04 MED ORDER — HYDRALAZINE HCL 20 MG/ML IJ SOLN
10.0000 mg | INTRAMUSCULAR | Status: DC | PRN
  Administered 2017-06-04: 10 mg via INTRAVENOUS
  Filled 2017-06-04: qty 1

## 2017-06-04 MED ORDER — CARVEDILOL 12.5 MG PO TABS: 12.5000 mg | ORAL_TABLET | Freq: Two times a day (BID) | ORAL | Status: DC

## 2017-06-04 MED ORDER — CLONIDINE HCL 0.2 MG/24HR TD PTWK
0.2000 mg | MEDICATED_PATCH | TRANSDERMAL | Status: DC
  Administered 2017-06-04: 0.2 mg via TRANSDERMAL
  Filled 2017-06-04: qty 1

## 2017-06-04 NOTE — Progress Notes (Signed)
Pharmacy Antibiotic Note  [name] is a 83 y.o. male  with aspiration pneumonia.  Pharmacy has been consulted for Unasyn dosing. -SCr= 2.96 (up from 1.13 on 10/3) -WBC= 15.3, tmax = 100, no cx  Plan: Continue Unasyn 1.5gm IV q12h -Will follow renal function and clinical progress  Height:  (180.3 cm) Weight: 176 lb (79.8 kg) IBW/kg (Calculated) : 75.3  Temp (24hrs), Avg:99.2 F (37.3 C), Min:98.7 F (37.1 C), Max:100 F (37.8 C)   Recent Labs Lab 05/31/17 0731 06/01/17 0500 06/02/17 0546 06/03/17 0403 06/04/17 0400 06/04/17 1508  WBC 8.1 6.8 11.0* 15.4* 15.3*  --   CREATININE 1.23 1.13 1.84* 1.65*  --  2.96*    Estimated Creatinine Clearance: 17.3 mL/min (A) (by C-G formula based on SCr of 2.96 mg/dL (H)).    No Known Allergies  Antimicrobials this admission: 10/4 Unasyn >>   Microbiology results: 10/2 MRSA PCR: negative   Thank you for allowing [place] to participate in this patients care.  Harland German, Pharm D 06/04/2017 4:42 PM

## 2017-06-04 NOTE — Progress Notes (Signed)
PROGRESS NOTE  [name] ZOX:[id number] DOB: [birth year] DOA: 05/30/2017 PCP: [name], MD   LOS: 5 days   Brief Narrative / Interim history: 83 y.o. male with medical history significant of hypertension, dementia was brought to the ER by the family for evaluation of fall. Patient is slightly confused but somewhat is at his baseline per the daughter at bedside.  He was admitted after a ground-level fall and found to have right frontal subarachnoid hemorrhage as well as small right frontal acute subdural hematoma.  Assessment & Plan: Active Problems:   Essential hypertension   Memory loss   Subarachnoid hemorrhage (HCC)   Aspiration pneumonia (HCC)   AKI (acute kidney injury) (HCC)   CKD (chronic kidney disease), stage II   Subarachnoid hemorrhage and subdural hematoma -Neurosurgery consulted, recommended conservative management.  Repeat CT scan of the brain essentially unchanged -Antiseizure prophylaxis for 7 days -Stable from this standpoint -Stopped aspirin -More lethargic this morning, repeat CT scan of the brain without significant acute findings, discussed with Dr. Bevely Palmer with neurosurgery  Fever due to suspect underlying aspiration pneumonia -febrile overnight on hospital day 1, chest x-ray concerning for pneumonia, suspect underlying aspiration in the setting of fall and syncope -Continue Unasyn, fever curve improving  Dementia -Supportive treatment, continue Aricept -Sundowning a little bit more overnight and more confused per family, suspect hospital induced delirium  -More sleepy today, ABG reassuring, CT scan as above without acute findings. Suspect inversion of circadian rhythm as he appeared to be agitated overnight and slept all day yesterday  Atrial fibrillation with RVR -Patient overnight 10/5 appears to have developed A. fib with RVR, suspect paroxysmal A. fib rather than brand-new -patient's CHA2DS2-VASc Score for Stroke Risk is >3,  -Continue Coreg,  not a candidate for anticoagulation  Low back pain -Lumbar pelvic x-ray without acute findings  Hypertension -Continue home medications, blood pressure still poorly controlled, he is asymptomatic, I added Norvasc, hydralazine, Coreg, poor p.o. intake today due to more lethargy, add clonidine patch -His Hyzaar is now stopped due to AKI  AKI on CKD II-III -Patient has slight elevation of creatinine throughout 2018, baseline appears to be between 1.1 and 1.5, worsening to 1.84 on 10/5, he was given IV fluids and improved to 1.6. -Hold Cozaar/HCTZ   DVT prophylaxis: SCD Code Status: Full code Family Communication: Discussed with daughter at bedside Disposition Plan: home when kidney function is better  Consultants:   Neurosurgery   Procedures:   None   Antimicrobials:  Unasyn 10/4 >>  Subjective: -no complaints, answers questions but falls back asleep readily   Objective: Vitals:   06/04/17 0624 06/04/17 0952 06/04/17 1100 06/04/17 1311  BP: 135/84 (!) 202/98 (!) 200/84 (!) 190/80  Pulse: 67 (!) 110 95   Resp: 16 (!) 26 20   Temp: 99.3 F (37.4 C)     TempSrc: Oral     SpO2: 96%  95%   Weight:      Height:        Intake/Output Summary (Last 24 hours) at 06/04/17 1533 Last data filed at 06/04/17 0051  Gross per 24 hour  Intake                0 ml  Output              700 ml  Net             -700 ml   Filed Weights   05/30/17 1147  Weight: 79.8 kg (176 lb)  Examination:  Constitutional: NAD Eyes: no scleral icterus Respiratory: CTA biL, no wheezing Cardiovascular: irregular, no murmurs, no edema Abdomen: Nondistended, no tenderness to palpation, positive bowel sounds Neurologic: non focal   Data Reviewed: I have independently reviewed following labs and imaging studies   CBC:  Recent Labs Lab 05/30/17 1340  05/31/17 0731 06/01/17 0500 06/02/17 0546 06/03/17 0403 06/04/17 0400  WBC 6.1  < > 8.1 6.8 11.0* 15.4* 15.3*  NEUTROABS 5.3  --   --    --   --   --   --   HGB 13.9  < > 12.3* 13.0 12.7* 13.3 13.1  HCT 42.1  < > 37.0* 38.2* 37.5* 39.3 38.2*  MCV 94.6  < > 91.8 91.8 91.7 91.6 91.6  PLT 171  < > 150 130* 141* 146* 128*  < > = values in this interval not displayed. Basic Metabolic Panel:  Recent Labs Lab 05/31/17 0219 05/31/17 0731 06/01/17 0500 06/02/17 0546 06/03/17 0403  NA 135 135 133* 132* 136  K 3.4* 3.4* 3.5 3.1* 3.3*  CL 99* 100* 96* 96* 100*  CO2 GLUCOSE 125* 116* 109* 135* 159*  BUN 21*  CREATININE 1.16 1.23 1.13 1.84* 1.65*  CALCIUM 9.1 8.8* 8.7* 8.7* 9.1   GFR: Estimated Creatinine Clearance: 31.1 mL/min (A) (by C-G formula based on SCr of 1.65 mg/dL (H)). Liver Function Tests:  Recent Labs Lab 05/30/17 1340  AST 29  ALT 22  ALKPHOS 77  BILITOT 0.7  PROT 7.5  ALBUMIN 4.4   No results for input(s): LIPASE, AMYLASE in the last 168 hours. No results for input(s): AMMONIA in the last 168 hours. Coagulation Profile: No results for input(s): INR, PROTIME in the last 168 hours. Cardiac Enzymes:  Recent Labs Lab 05/30/17 1340 05/30/17 2154 05/31/17 0219 05/31/17 0731  TROPONINI 0.17* 0.25* 0.35* 0.42*   BNP (last 3 results) No results for input(s): PROBNP in the last 8760 hours. HbA1C: No results for input(s): HGBA1C in the last 72 hours. CBG: No results for input(s): GLUCAP in the last 168 hours. Lipid Profile: No results for input(s): CHOL, HDL, LDLCALC, TRIG, CHOLHDL, LDLDIRECT in the last 72 hours. Thyroid Function Tests: No results for input(s): TSH, T4TOTAL, FREET4, T3FREE, THYROIDAB in the last 72 hours. Anemia Panel: No results for input(s): VITAMINB12, FOLATE, FERRITIN, TIBC, IRON, RETICCTPCT in the last 72 hours. Urine analysis:    Component Value Date/Time   COLORURINE YELLOW 05/31/2017 1125   APPEARANCEUR CLEAR 05/31/2017 1125   LABSPEC 1.018 05/31/2017 1125   PHURINE 5.0 05/31/2017 1125   GLUCOSEU NEGATIVE 05/31/2017 1125   HGBUR  MODERATE (A) 05/31/2017 1125   HGBUR small 10/22/2008 0859   BILIRUBINUR NEGATIVE 05/31/2017 1125   KETONESUR 5 (A) 05/31/2017 1125   PROTEINUR 100 (A) 05/31/2017 1125   UROBILINOGEN 0.2 10/22/2008 0859   NITRITE NEGATIVE 05/31/2017 1125   LEUKOCYTESUR NEGATIVE 05/31/2017 1125   Sepsis Labs: Invalid input(s): PROCALCITONIN, LACTICIDVEN  Recent Results (from the past 240 hour(s))  MRSA PCR Screening     Status: None   Collection Time: 05/30/17  9:08 PM  Result Value Ref Range Status   MRSA by PCR NEGATIVE NEGATIVE Final    Comment:        The GeneXpert MRSA Assay (FDA approved for NASAL specimens only), is one component of a comprehensive MRSA colonization surveillance program. It is not intended to diagnose MRSA infection nor to guide or monitor treatment for MRSA  infections.       Radiology Studies: Ct Head Wo Contrast  Result Date: 06/04/2017 CLINICAL DATA:  Followup for intracranial hemorrhage. EXAM: CT HEAD WITHOUT CONTRAST TECHNIQUE: Contiguous axial images were obtained from the base of the skull through the vertex without intravenous contrast. COMPARISON:  05/31/2017 FINDINGS: Brain: Right frontal subarachnoid hemorrhage has decreased in thickness. Mild associated right anterior frontal lobe parenchymal hemorrhage has also slightly decreased from the prior exam. There is a widening of the extra-axial spaces over both hemispheres, left greater than right, left measuring 9 mm in thickness and right 6 mm. This fluid is of relative increased attenuation compared to the ventricular fluid likely due to contents containing products of hemorrhage. This creates mild mass effect flattening the underlying gyri and mildly decreasing size of the lateral ventricles. There are no parenchymal masses. There is no evidence of an ischemic infarct. Hemorrhage noted in the dependent lateral ventricles with prior exam is not appreciated currently. There is no new intracranial hemorrhage. Vascular:  No hyperdense vessel or unexpected calcification. Skull: No skull fracture or skull lesion. IMPRESSION: 1. Previously noted hemorrhage has improved, decreased in size and conspicuity. There is no new acute intracranial hemorrhage. 2. There is a widening of the extra-axial spaces over both hemispheres. This leads to mild mass effect. 3. No evidence of an ischemic infarct. Electronically Signed   By: Amie Portland M.D.   On: 06/04/2017 13:05     Scheduled Meds: . carvedilol  12.5 mg Oral BID WC  . cholecalciferol  5,000 Units Oral Daily  . cloNIDine  0.2 mg Transdermal Weekly  . docusate  100 mg Oral BID  . donepezil  10 mg Oral QHS  . feeding supplement (ENSURE ENLIVE)  237 mL Oral TID BM  . hydrALAZINE  10 mg Oral Q8H  . levETIRAcetam  500 mg Oral BID  . multivitamin with minerals  1 tablet Oral Daily  . pantoprazole sodium  40 mg Oral Daily   Or  . pantoprazole  40 mg Oral Daily  . senna   Oral QODAY   Continuous Infusions: . ampicillin-sulbactam (UNASYN) IV Stopped (06/04/17 0600)    Pamella Pert, MD, PhD [facility] Pager [phone]  If 7PM-7AM, please contact night-coverage [email or web address] Password [facility] 06/04/2017, 3:33 PM

## 2017-06-04 NOTE — Progress Notes (Signed)
Pt. VS were altered, pt much less alert. Appears IV ativan may have sedated patient and be lingering in system. MD made aware and has ordered 10 mg Labetalol, ABG's and CT of head. However pt. Did not receive any morning meds this morning because wasn't safe to give them. 11:30am rechecked his vitals and BP still elevated MD notified and new orders have been placed.

## 2017-06-05 LAB — BASIC METABOLIC PANEL
ANION GAP: 9 (ref 5–15)
BUN: 48 mg/dL — ABNORMAL HIGH (ref 6–20)
CALCIUM: 8.7 mg/dL — AB (ref 8.9–10.3)
CO2: 28 mmol/L (ref 22–32)
CREATININE: 2.03 mg/dL — AB (ref 0.61–1.24)
Chloride: 104 mmol/L (ref 101–111)
GFR, EST AFRICAN AMERICAN: 31 mL/min — AB (ref >60)
GFR, EST NON AFRICAN AMERICAN: 27 mL/min — AB (ref >60)
Glucose, Bld: 123 mg/dL — ABNORMAL HIGH (ref 65–99)
Potassium: 3.1 mmol/L — ABNORMAL LOW (ref 3.5–5.1)
SODIUM: 141 mmol/L (ref 135–145)

## 2017-06-05 LAB — CBC
HCT: 36.4 % — ABNORMAL LOW (ref 39.0–52.0)
HEMOGLOBIN: 12.3 g/dL — AB (ref 13.0–17.0)
MCH: 31.4 pg (ref 26.0–34.0)
MCHC: 33.8 g/dL (ref 30.0–36.0)
MCV: 92.9 fL (ref 78.0–100.0)
Platelets: 120 10*3/uL — ABNORMAL LOW (ref 150–400)
RBC: 3.92 MIL/uL — AB (ref 4.22–5.81)
RDW: 13.2 % (ref 11.5–15.5)
WBC: 16.8 10*3/uL — AB (ref 4.0–10.5)

## 2017-06-05 NOTE — Progress Notes (Signed)
PROGRESS NOTE  [name] ZOX:[id number] DOB: 25-Apr-1924 DOA: 05/30/2017 PCP: [name], MD   LOS: 6 days   Brief Narrative / Interim history: 83 y.o. male with medical history significant of hypertension, dementia was brought to the ER by the family for evaluation of fall. Patient is slightly confused but somewhat is at his baseline per the daughter at bedside.  He was admitted after a ground-level fall and found to have right frontal subarachnoid hemorrhage as well as small right frontal acute subdural hematoma.  Assessment & Plan: Active Problems:   Essential hypertension   Memory loss   Subarachnoid hemorrhage (HCC)   Aspiration pneumonia (HCC)   AKI (acute kidney injury) (HCC)   CKD (chronic kidney disease), stage II    Subarachnoid hemorrhage and subdural hematoma -Neurosurgery consulted, recommended conservative management.  Repeat CT scan of the brain 24 hours as well as on 10/7 essentially unchanged, discussed with Dr. Bevely Palmer -Antiseizure prophylaxis for 7 days -Stable from this standpoint -Stopped aspirin  Due to urinary retention -Due to #1 as well as hospital stay, placed Foley catheter on 10/7, improvement in his urine output as well as his creatinine  Fever due to suspect underlying aspiration pneumonia -febrile overnight on hospital day 1, chest x-ray concerning for pneumonia, suspect underlying aspiration in the setting of fall and syncope -Continue Unasyn, fever curve improving  Dementia -Supportive treatment, continue Aricept -Sundowning a little bit more overnight and more confused per family, suspect hospital induced delirium  -More sleepy 10/7, ABG reassuring, CT scan as above without acute findings.  Improved today  Atrial fibrillation with RVR -Patient overnight 10/5 appears to have developed A. fib with RVR, suspect paroxysmal A. fib rather than brand-new -patient's CHA2DS2-VASc Score for Stroke Risk is >3,  -Continue Coreg, not a candidate for  anticoagulation given intracranial hemorrhage  Low back pain -Lumbar pelvic x-ray without acute findings  Hypertension -Continue Coreg, hydralazine.  Added clonidine on 10/7 due to systolic persistently in the 200s, improved today -His Hyzaar is now stopped due to AKI  AKI on CKD II-III -Patient has slight elevation of creatinine throughout 2018, baseline appears to be between 1.1 and 1.5, worsened acutely to 2.9 on 10/7 due to urinary retention, improved to 2.0 after Foley catheter placement -Hold Cozaar/HCTZ   DVT prophylaxis: SCD Code Status: Full code Family Communication: No family at bedside Disposition Plan: SNF 1 day if kidney function is improving  Consultants:   Neurosurgery   Procedures:   None   Antimicrobials:  Unasyn 10/4 >> plan for 5 total days  Subjective: -Pleasantly demented, no complaints, no chest pain, no shortness of breath  Objective: Vitals:   06/04/17 2149 06/05/17 0613 06/05/17 0820 06/05/17 0825  BP: (!) 152/96 (!) 185/81 (!) 143/88   Pulse: (!) 102 98  66  Resp: 18 17    Temp: 99.9 F (37.7 C) 100.3 F (37.9 C)    TempSrc:      SpO2: 94% 94%    Weight:      Height:        Intake/Output Summary (Last 24 hours) at 06/05/17 1150 Last data filed at 06/05/17 1017  Gross per 24 hour  Intake              927 ml  Output             2250 ml  Net            -1323 ml   Filed Weights   05/30/17  1147  Weight: 79.8 kg (176 lb)    Examination:  Constitutional: NAD Eyes: No scleral icterus Respiratory: Clear to auscultation bilaterally, no wheezing, no crackles Cardiovascular: Irregularly irregular, no murmurs, no lower extremity edema Abdomen: Nondistended, no tenderness to palpation, positive bowel sounds Neurologic: No focal findings  Data Reviewed: I have independently reviewed following labs and imaging studies   CBC:  Recent Labs Lab 05/30/17 1340  06/01/17 0500 06/02/17 0546 06/03/17 0403 06/04/17 0400 06/05/17 0444    WBC 6.1  < > 6.8 11.0* 15.4* 15.3* 16.8*  NEUTROABS 5.3  --   --   --   --   --   --   HGB 13.9  < > 13.0 12.7* 13.3 13.1 12.3*  HCT 42.1  < > 38.2* 37.5* 39.3 38.2* 36.4*  MCV 94.6  < > 91.8 91.7 91.6 91.6 92.9  PLT 171  < > 130* 141* 146* 128* 120*  < > = values in this interval not displayed. Basic Metabolic Panel:  Recent Labs Lab 06/01/17 0500 06/02/17 0546 06/03/17 0403 06/04/17 1508 06/05/17 0444  NA 133* 132* 136 139 141  K 3.5 3.1* 3.3* 3.7 3.1*  CL 96* 96* 100* 102 104  CO2 GLUCOSE 109* 135* 159* 158* 123*  BUN 13 18 21* 53* 48*  CREATININE 1.13 1.84* 1.65* 2.96* 2.03*  CALCIUM 8.7* 8.7* 9.1 9.3 8.7*   GFR: Estimated Creatinine Clearance: 25.2 mL/min (A) (by C-G formula based on SCr of 2.03 mg/dL (H)). Liver Function Tests:  Recent Labs Lab 05/30/17 1340  AST 29  ALT 22  ALKPHOS 77  BILITOT 0.7  PROT 7.5  ALBUMIN 4.4   No results for input(s): LIPASE, AMYLASE in the last 168 hours. No results for input(s): AMMONIA in the last 168 hours. Coagulation Profile: No results for input(s): INR, PROTIME in the last 168 hours. Cardiac Enzymes:  Recent Labs Lab 05/30/17 1340 05/30/17 2154 05/31/17 0219 05/31/17 0731  TROPONINI 0.17* 0.25* 0.35* 0.42*   BNP (last 3 results) No results for input(s): PROBNP in the last 8760 hours. HbA1C: No results for input(s): HGBA1C in the last 72 hours. CBG: No results for input(s): GLUCAP in the last 168 hours. Lipid Profile: No results for input(s): CHOL, HDL, LDLCALC, TRIG, CHOLHDL, LDLDIRECT in the last 72 hours. Thyroid Function Tests: No results for input(s): TSH, T4TOTAL, FREET4, T3FREE, THYROIDAB in the last 72 hours. Anemia Panel: No results for input(s): VITAMINB12, FOLATE, FERRITIN, TIBC, IRON, RETICCTPCT in the last 72 hours. Urine analysis:    Component Value Date/Time   COLORURINE YELLOW 06/04/2017 1930   APPEARANCEUR CLOUDY (A) 06/04/2017 1930   LABSPEC 1.023 06/04/2017 1930    PHURINE 6.0 06/04/2017 1930   GLUCOSEU NEGATIVE 06/04/2017 1930   HGBUR LARGE (A) 06/04/2017 1930   HGBUR small 10/22/2008 0859   BILIRUBINUR NEGATIVE 06/04/2017 1930   KETONESUR NEGATIVE 06/04/2017 1930   PROTEINUR 30 (A) 06/04/2017 1930   UROBILINOGEN 0.2 10/22/2008 0859   NITRITE NEGATIVE 06/04/2017 1930   LEUKOCYTESUR TRACE (A) 06/04/2017 1930   Sepsis Labs: Invalid input(s): PROCALCITONIN, LACTICIDVEN  Recent Results (from the past 240 hour(s))  MRSA PCR Screening     Status: None   Collection Time: 05/30/17  9:08 PM  Result Value Ref Range Status   MRSA by PCR NEGATIVE NEGATIVE Final    Comment:        The GeneXpert MRSA Assay (FDA approved for NASAL specimens only), is one component of a comprehensive MRSA  colonization surveillance program. It is not intended to diagnose MRSA infection nor to guide or monitor treatment for MRSA infections.       Radiology Studies: Ct Head Wo Contrast  Result Date: 06/04/2017 CLINICAL DATA:  Followup for intracranial hemorrhage. EXAM: CT HEAD WITHOUT CONTRAST TECHNIQUE: Contiguous axial images were obtained from the base of the skull through the vertex without intravenous contrast. COMPARISON:  05/31/2017 FINDINGS: Brain: Right frontal subarachnoid hemorrhage has decreased in thickness. Mild associated right anterior frontal lobe parenchymal hemorrhage has also slightly decreased from the prior exam. There is a widening of the extra-axial spaces over both hemispheres, left greater than right, left measuring 9 mm in thickness and right 6 mm. This fluid is of relative increased attenuation compared to the ventricular fluid likely due to contents containing products of hemorrhage. This creates mild mass effect flattening the underlying gyri and mildly decreasing size of the lateral ventricles. There are no parenchymal masses. There is no evidence of an ischemic infarct. Hemorrhage noted in the dependent lateral ventricles with prior exam is not  appreciated currently. There is no new intracranial hemorrhage. Vascular: No hyperdense vessel or unexpected calcification. Skull: No skull fracture or skull lesion. IMPRESSION: 1. Previously noted hemorrhage has improved, decreased in size and conspicuity. There is no new acute intracranial hemorrhage. 2. There is a widening of the extra-axial spaces over both hemispheres. This leads to mild mass effect. 3. No evidence of an ischemic infarct. Electronically Signed   By: Amie Portland M.D.   On: 06/04/2017 13:05     Scheduled Meds: . carvedilol  12.5 mg Oral BID WC  . cholecalciferol  5,000 Units Oral Daily  . cloNIDine  0.2 mg Transdermal Weekly  . docusate  100 mg Oral BID  . donepezil  10 mg Oral QHS  . feeding supplement (ENSURE ENLIVE)  237 mL Oral TID BM  . hydrALAZINE  10 mg Oral Q8H  . levETIRAcetam  500 mg Oral BID  . multivitamin with minerals  1 tablet Oral Daily  . pantoprazole sodium  40 mg Oral Daily   Or  . pantoprazole  40 mg Oral Daily  . senna   Oral QODAY   Continuous Infusions: . sodium chloride 75 mL/hr at 06/04/17 1845  . ampicillin-sulbactam (UNASYN) IV Stopped (06/05/17 6962)    Pamella Pert, MD, PhD [facility] Pager [phone]  If 7PM-7AM, please contact night-coverage [email or web address] Password [email or web address] 06/05/2017, 11:50 AM

## 2017-06-05 NOTE — Care Management Important Message (Signed)
Important Message  Patient Details  Name: [name] MRN: [id number] Date of Birth: 08/23/1924   Medicare Important Message Given:  Yes    Kyla Balzarine 06/05/2017, 8:48 AM

## 2017-06-05 NOTE — Progress Notes (Signed)
  Speech Language Pathology Treatment: Dysphagia  Patient Details Name: [name] MRN: [id number] DOB: Sep 03, 1923 Today's Date: 06/05/2017 Time: 7829-5621 SLP Time Calculation (min) (ACUTE ONLY): 17 min  Assessment / Plan / Recommendation Patient seen for swallowing treatment with focus on diet tolerance and potential to advance. Patient upright in chair following physical therapy. Largely non-verbal but alert. Significant oral holding noted in 100% of po solid trials without notable attempts to orally transit bolus despite max verbal and visual cueing. Liquid wash assisted to facilitate pharyngeal swallow with clearance of 75% of bolus, remainder suctioned from oral cavity by therapist. Although liquid wash aided in oral transit and no significant s/s of aspiration present (subtle throat clear noted x1), cognitively based oral dysfunction does increase aspiration risk. Note CXR 10/4 with patchy LLL opacity, atelctasis vs PNA. Being treated currently for suspected aspiration PNA. Will continue to f/u closely.    HPI HPI: Pt is a 83 y.o.malewith medical history significant ofhypertension and dementia who was brought to the ER by the family for evaluation s/p fall. CT Head showed posttraumatic right frontal subarachnoid hemorrhage, small right frontal acute subdural hematoma representing contrecoup injury, left occipital diastasis, scalp hematoma and slight subcutaneous air due to laceration. CT of the cervical spine showed advanced cervical spondylosis. Pt was transferred to [facility] for closer monitoring but with no surgical intervention indicated at this time.       SLP Plan  Continue with current plan of care       Recommendations  Diet recommendations: Dysphagia 1 (puree);Thin liquid Liquids provided via: Cup;Straw Medication Administration: Crushed with puree Supervision: Staff to assist with self feeding;Full supervision/cueing for compensatory strategies Compensations: Slow  rate;Small sips/bites;Follow solids with liquid Postural Changes and/or Swallow Maneuvers: Seated upright 90 degrees                Oral Care Recommendations: Oral care BID Follow up Recommendations: Home health SLP;24 hour supervision/assistance SLP Visit Diagnosis: Dysphagia, unspecified (R13.10);Cognitive communication deficit (H08.657) Plan: Continue with current plan of care       GO             [employer] MA, CCC-SLP [phone]    [name] [name] 06/05/2017, 4:25 PM

## 2017-06-05 NOTE — Progress Notes (Signed)
Physical Therapy Treatment Patient Details Name: [name] MRN: [id number] DOB: Oct 20, 1923 Today's Date: 06/05/2017    History of Present Illness 83 y.o.malewith medical history significant ofhypertension, dementia admitted after a ground-level fall and found to have right frontal subarachnoid hemorrhage as well as small right frontal acute subdural hematoma.    PT Comments    Patient required max A +2 for bed mobility and lateral scoot transfer EOB to recliner. Pt demonstrated significantly decreased mobility versus prior PT session on 10/4. Attempted to stand however pt unable and not assisting to stand. Pt with R lateral in sitting and c/o back pain which daughter reported is chronic. Pt communicated minimally with therapist but did respond appropriately.  Daughter present throughout session. Current plan remains appropriate.   Follow Up Recommendations  SNF     Equipment Recommendations  None recommended by PT    Recommendations for Other Services       Precautions / Restrictions Precautions Precautions: Fall Restrictions Weight Bearing Restrictions: No    Mobility  Bed Mobility Overal bed mobility: Needs Assistance Bed Mobility: Supine to Sit     Supine to sit: Max assist;+2 for physical assistance;HOB elevated     General bed mobility comments: assist to come into sitting with use of bed pad and HOB elevated; cues for sequencing  Transfers Overall transfer level: Needs assistance   Transfers: Sit to/from Stand;Lateral/Scoot Transfers Sit to Stand: Total assist;+2 physical assistance        Lateral/Scoot Transfers: Max assist;+2 physical assistance General transfer comment: attempted to stand however pt not assisting; pt was able to assist minimally with lateral scoot to recliner with use of bed pad; cues for sequencing and hand placement   Ambulation/Gait         Gait velocity: decr       Stairs            Wheelchair Mobility     Modified Rankin (Stroke Patients Only)       Balance Overall balance assessment: Needs assistance Sitting-balance support: No upper extremity supported;Bilateral upper extremity supported Sitting balance-Leahy Scale: Poor Sitting balance - Comments: pt required mod/max A for sitting balance when attempting to sit upright and in midline; pt able to maitnain balance when leaned forward on forearms with min guard/min A                                     Cognition Arousal/Alertness: Awake/alert Behavior During Therapy: Flat affect Overall Cognitive Status: Impaired/Different from baseline Area of Impairment: Attention;Safety/judgement;Awareness;Problem solving;Following commands                   Current Attention Level: Sustained   Following Commands: Follows one step commands inconsistently;Follows one step commands with increased time Safety/Judgement: Decreased awareness of safety;Decreased awareness of deficits Awareness: Intellectual Problem Solving: Slow processing;Decreased initiation;Difficulty sequencing;Requires verbal cues General Comments: daughter present during session; pt spoke minimally to therapist but did respond appropriately       Exercises      General Comments        Pertinent Vitals/Pain Pain Assessment: Faces Faces Pain Scale: Hurts little more Pain Location: back Pain Descriptors / Indicators: Guarding;Moaning;Grimacing;Sore Pain Intervention(s): Limited activity within patient's tolerance;Monitored during session;Repositioned    Home Living                      Prior Function  PT Goals (current goals can now be found in the care plan section) Acute Rehab PT Goals PT Goal Formulation: Patient unable to participate in goal setting Time For Goal Achievement: 06/07/17 Potential to Achieve Goals: Good Progress towards PT goals: Not progressing toward goals - comment    Frequency    Min  3X/week      PT Plan Current plan remains appropriate    Co-evaluation              AM-PAC PT "6 Clicks" Daily Activity  Outcome Measure  Difficulty turning over in bed (including adjusting bedclothes, sheets and blankets)?: Unable Difficulty moving from lying on back to sitting on the side of the bed? : Unable Difficulty sitting down on and standing up from a chair with arms (e.g., wheelchair, bedside commode, etc,.)?: Unable Help needed moving to and from a bed to chair (including a wheelchair)?: A Lot Help needed walking in hospital room?: Total Help needed climbing 3-5 steps with a railing? : Total 6 Click Score: 7    End of Session Equipment Utilized During Treatment: Gait belt Activity Tolerance: Patient tolerated treatment well;Patient limited by fatigue Patient left: in chair;with call bell/phone within reach;with chair alarm set;with nursing/sitter in room;with family/visitor present Nurse Communication: Mobility status;Need for lift equipment (lift pad in room) PT Visit Diagnosis: Unsteadiness on feet (R26.81);Muscle weakness (generalized) (M62.81);Other abnormalities of gait and mobility (R26.89)     Time: 1610-9604 PT Time Calculation (min) (ACUTE ONLY): 37 min  Charges:  $Therapeutic Activity: 23-37 mins                    G Codes:       Erline Levine, PTA Pager: [phone]     Carolynne Edouard 06/05/2017, 4:40 PM

## 2017-06-05 NOTE — Progress Notes (Signed)
Will need PT to see patient again to resubmit to insurance for dc tomorrow.   Osborne Casco Gizella [place] LCSWA [phone]

## 2017-06-06 DIAGNOSIS — R338 Other retention of urine: Secondary | ICD-10-CM

## 2017-06-06 LAB — CBC
HEMATOCRIT: 35 % — AB (ref 39.0–52.0)
Hemoglobin: 12 g/dL — ABNORMAL LOW (ref 13.0–17.0)
MCH: 32.3 pg (ref 26.0–34.0)
MCHC: 34.3 g/dL (ref 30.0–36.0)
MCV: 94.3 fL (ref 78.0–100.0)
Platelets: 115 10*3/uL — ABNORMAL LOW (ref 150–400)
RBC: 3.71 MIL/uL — ABNORMAL LOW (ref 4.22–5.81)
RDW: 13.1 % (ref 11.5–15.5)
WBC: 14.1 10*3/uL — ABNORMAL HIGH (ref 4.0–10.5)

## 2017-06-06 LAB — BASIC METABOLIC PANEL
Anion gap: 10 (ref 5–15)
BUN: 42 mg/dL — AB (ref 6–20)
CHLORIDE: 107 mmol/L (ref 101–111)
CO2: 29 mmol/L (ref 22–32)
Calcium: 8.7 mg/dL — ABNORMAL LOW (ref 8.9–10.3)
Creatinine, Ser: 1.42 mg/dL — ABNORMAL HIGH (ref 0.61–1.24)
GFR calc Af Amer: 48 mL/min — ABNORMAL LOW (ref >60)
GFR, EST NON AFRICAN AMERICAN: 42 mL/min — AB (ref >60)
GLUCOSE: 115 mg/dL — AB (ref 65–99)
POTASSIUM: 3.1 mmol/L — AB (ref 3.5–5.1)
Sodium: 146 mmol/L — ABNORMAL HIGH (ref 135–145)

## 2017-06-06 MED ORDER — AMOXICILLIN-POT CLAVULANATE 875-125 MG PO TABS: 1.0000 | ORAL_TABLET | Freq: Two times a day (BID) | ORAL | Status: AC

## 2017-06-06 MED ORDER — CARVEDILOL 12.5 MG PO TABS: 12.5000 mg | ORAL_TABLET | Freq: Two times a day (BID) | ORAL | Status: AC

## 2017-06-06 MED ORDER — LEVETIRACETAM 100 MG/ML PO SOLN: 500.0000 mg | Freq: Two times a day (BID) | ORAL | 12 refills | Status: DC

## 2017-06-06 MED ORDER — SODIUM CHLORIDE 0.9 % IV SOLN
1.5000 g | Freq: Three times a day (TID) | INTRAVENOUS | Status: DC
  Filled 2017-06-06 (×2): qty 1.5

## 2017-06-06 MED ORDER — LORAZEPAM 0.5 MG PO TABS: 0.5000 mg | ORAL_TABLET | Freq: Two times a day (BID) | ORAL | 0 refills | Status: AC

## 2017-06-06 MED ORDER — HYDRALAZINE HCL 10 MG PO TABS: 10.0000 mg | ORAL_TABLET | Freq: Three times a day (TID) | ORAL | Status: AC

## 2017-06-06 MED ORDER — LEVETIRACETAM 100 MG/ML PO SOLN: 500.0000 mg | Freq: Two times a day (BID) | ORAL | 12 refills | Status: AC

## 2017-06-06 MED ORDER — POTASSIUM CHLORIDE CRYS ER 20 MEQ PO TBCR
40.0000 meq | EXTENDED_RELEASE_TABLET | ORAL | Status: AC
  Administered 2017-06-06 (×2): 40 meq via ORAL
  Filled 2017-06-06 (×2): qty 2

## 2017-06-06 NOTE — Progress Notes (Signed)
Report called to Verdia Kuba, LPN. All questions answered.

## 2017-06-06 NOTE — Discharge Summary (Addendum)
Physician Discharge Summary  Codie Krogh WUJ:[id number] DOB: 01/22/1924 DOA: 05/30/2017  PCP: [name], MD  Admit date: 05/30/2017 Discharge date: 06/06/2017  Admitted From: home Disposition:  SNF  Recommendations for Outpatient Follow-up:  1. Follow up with PCP in 1-2 weeks 2. Continue Keppra for 2 more days for seizure prophylaxis 3. Continue Augmentin for 2 more days for aspiration pneumonia 4. Monitor blood pressure and uptitrate medications if needed  Home Health: none Equipment/Devices: none  Discharge Condition: stable CODE STATUS: Full code Diet recommendation: regular  HPI: Per Dr. Nelson Chimes, [name] is a 83 y.o. male with medical history significant of hypertension, dementia was brought to the ER by the family for evaluation of fall. Patient is slightly confused but somewhat is at his baseline per the daughter at bedside. History per daughter. Daughter states patient went out for his routine walk to the mall this morning where the bystanders saw him fall and hit his head on the ground. Due to this patient was brought to the ER for further evaluation. Patient denies any warning signs prior to the fall but does admit he often gets dizzy during change in position but tries to be careful due to this. He was admitted secondary to an episode of syncope back in January 2018 at [facility] and was found to have new left bundle branch block and a stress test showed low risk, echocardiogram showed ejection fraction 55-60 percent otherwise no significant valvular abnormality. Per daughter patient is otherwise healthy as he could be for a 83 year old and has his routine activity of going to the mall daily and walking. Most the times he goes by himself with the help of cane and performs his own basic tasks. In the past several months he has had 2 or 3 episodes of "black outs" per the daughter lasting several seconds but unable to give me any further detailed information. In the ER due  to the fall CT of the head was done which showed posttraumatic right frontal subarachnoid hemorrhage, small right frontal acute subdural hematoma representing contrecoup injury, left occipital diastasis, scalp hematoma and slight subcutaneous air due to laceration. CT of the cervical spine showed advanced cervical spondylosis but no fracture or subluxation. ase was discussed with neurosurgery at [facility] who recommended transferring the patient to [facility] for closer monitoring and the stepdown unit but no surgical intervention indicated at this time. They also recommended repeating CT head tomorrow morning and starting the patient on seizure prophylaxis. Patient will be seen by them once the patient arrives and will follow along. This was discussed by the ER physician with the neurosurgery provider on call. In the ER patient was given 5 mg of Norvasc, hydrochlorothiazide 25 mg and Cozaar 100 mg. IV Keppra was ordered. All think patient reports off to me at this time is his lower back pain but per daughter patient is always complaining of lower back pain.   Hospital Course: Discharge Diagnoses:  Active Problems:   Essential hypertension   Memory loss   Subarachnoid hemorrhage (HCC)   Aspiration pneumonia of both lower lobes due to gastric secretions (HCC)   AKI (acute kidney injury) (HCC)   CKD (chronic kidney disease), stage II   Acute urinary retention   Subarachnoid hemorrhage and subdural hematoma -post fall, Neurosurgery consulted, recommended conservative management.  Repeat CT scan of the brain 24 hours as well as on 10/7 essentially unchanged, discussed with Dr. Bevely Palmer. Neurosurgery recommends antizeizure prophylaxis for 7 days, 2  days remaining on discharge. Stop his aspirin Acute urinary retention -likely due to #1 as well as hospital stay / poor mobility, placed Foley catheter on 10/7, improvement in his urine output as well as his creatinine. Discharge with Foley and  consider voiding trial in 3-4 days once he is more mobile  Fever due to suspect underlying aspiration pneumonia -febrile overnight on hospital day 1, chest x-ray concerning for pneumonia, suspect underlying aspiration in the setting of fall and syncope. He was placed on Unasyn, his fevers resolved, will transition to Augmentin for 2 additional days.  Dementia -Supportive treatment, continue Aricept, not agitated, and more confused per family, suspect hospital induced delirium. Day/night rhythm also affected, suspect will return to normal once in a stable environment.  Atrial fibrillation with RVR -Patient overnight 10/5 appears to have developed A. fib with RVR, suspect paroxysmal A. fib rather than brand-new, patient's CHA2DS2-VASc Score for Stroke Risk is >3, continue Coreg, now rate controlled, not a candidate for anticoagulation given intracranial hemorrhage Low back pain -Lumbar pelvic x-ray without acute findings Hypertension -Continue Coreg, hydralazine. 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 Please recheck a BMP in 3-4 days   Discharge Instructions   Allergies as of 06/06/2017   No Known Allergies     Medication List    STOP taking these medications   aspirin EC 81 MG tablet   losartan-hydrochlorothiazide 100-25 MG tablet Commonly known as:  HYZAAR     TAKE these medications   acetaminophen 500 MG tablet Commonly known as:  TYLENOL Take 500-1,000 mg by mouth every 6 (six) hours as needed for moderate pain.   amoxicillin-clavulanate 875-125 MG tablet Commonly known as:  AUGMENTIN Take 1 tablet by mouth 2 (two) times  daily. For 2 more days   BIOFREEZE EX Apply 1 application topically 5 (five) times daily as needed (back pain).   carvedilol 12.5 MG tablet Commonly known as:  COREG Take 1 tablet (12.5 mg total) by mouth 2 (two) times daily with a meal.   donepezil 10 MG tablet Commonly known as:  ARICEPT Take 1 tablet (10 mg total) by mouth at bedtime.   Fish Oil 1000 MG Caps Take 1,000 mg by mouth daily.   GARLIC PO Take 1 capsule by mouth daily.   hydrALAZINE 10 MG tablet Commonly known as:  APRESOLINE Take 1 tablet (10 mg total) by mouth every 8 (eight) hours.   ICY HOT LIDOCAINE PLUS MENTHOL EX Apply 1 application topically 5 (five) times daily as needed (back pain).   levETIRAcetam 100 MG/ML solution Commonly known as:  KEPPRA Take 5 mLs (500 mg total) by mouth 2 (two) times daily. For 2 more days   LORazepam 0.5 MG tablet Commonly known as:  ATIVAN Take 1 tablet (0.5 mg total) by mouth 2 (two) times daily. What changed:  when to take this  reasons to take this   multivitamin with minerals Tabs tablet Take 1 tablet by mouth daily.   Jackson LAXATIVE 25 MG Tabs Generic drug:  Sennosides Take 25 mg by mouth every other day.   Vitamin D-3 5000 units Tabs Take 5,000 Units by mouth daily.  Durable Medical Equipment        Start     Ordered   06/01/17 1216  For home use only DME Walker rolling  Once    Question:  Patient needs a walker to treat with the following condition  Answer:  Weakness   06/01/17 1216      Contact information for follow-up providers    [facility]-Home Follow up.   Why:  home health services arranged, office will call and set up home visits Contact information: [address] [place] [address] [phone]        [facility] - Dme Follow up.   Why:  rolling walker will be delivered to bedside prior to discharge Contact information: [address] [place]  [address] [phone]            Contact information for after-discharge care    Destination    [facility] .   Specialty:  Skilled Nursing Facility Contact information: [address] [place] [address] [phone]                  Consultations:  Neurosurgery   Procedures/Studies:  Dg Shoulder 1 View Left  Result Date: 05/30/2017 CLINICAL DATA:  Status post fall striking the left shoulder. EXAM: LEFT SHOULDER - 1 VIEW COMPARISON:  Chest x-ray dated September 25, 2016 which included a portion of the left shoulder. FINDINGS: A single AP of the shoulder has the humerus rotated internally. The bones are subjectively adequately mineralized. There is no definite acute fracture or dislocation. IMPRESSION: No acute bony abnormality of the left shoulder is observed on this single view. A full shoulder series is recommended. Electronically Signed   By: David  [place] M.D.   On: 05/30/2017 13:15   Ct Head Wo Contrast  Result Date: 06/04/2017 CLINICAL DATA:  Followup for intracranial hemorrhage. EXAM: CT HEAD WITHOUT CONTRAST TECHNIQUE: Contiguous axial images were obtained from the base of the skull through the vertex without intravenous contrast. COMPARISON:  05/31/2017 FINDINGS: Brain: Right frontal subarachnoid hemorrhage has decreased in thickness. Mild associated right anterior frontal lobe parenchymal hemorrhage has also slightly decreased from the prior exam. There is a widening of the extra-axial spaces over both hemispheres, left greater than right, left measuring 9 mm in thickness and right 6 mm. This fluid is of relative increased attenuation compared to the ventricular fluid likely due to contents containing products of hemorrhage. This creates mild mass effect flattening the underlying gyri and mildly decreasing size of the lateral ventricles. There are no parenchymal masses. There is no evidence of an ischemic infarct. Hemorrhage noted in the  dependent lateral ventricles with prior exam is not appreciated currently. There is no new intracranial hemorrhage. Vascular: No hyperdense vessel or unexpected calcification. Skull: No skull fracture or skull lesion. IMPRESSION: 1. Previously noted hemorrhage has improved, decreased in size and conspicuity. There is no new acute intracranial hemorrhage. 2. There is a widening of the extra-axial spaces over both hemispheres. This leads to mild mass effect. 3. No evidence of an ischemic infarct. Electronically Signed   By: Amie Portland M.D.   On: 06/04/2017 13:05   Ct Head Wo Contrast  Result Date: 05/31/2017 CLINICAL DATA:  Followup head CT for bleed. EXAM: CT HEAD WITHOUT CONTRAST TECHNIQUE: Contiguous axial images were obtained from the base of the skull through the vertex without intravenous contrast. COMPARISON:  05/30/2017 and 11/08/2010 FINDINGS: Brain: Examination demonstrates evidence of patient's  acute subarachnoid hemorrhage over the right frontal region without significant change. Tiny acute right subdural hematoma over the right frontal region without significant change. Possible tiny focus of acute subarachnoid hemorrhage over the left posterior temporal region without significant change. Small amount of intraventricular hemorrhage over the occipital horns bilaterally which is new. No evidence of significant mass effect or midline shift. Vascular: No hyperdense vessel or unexpected calcification. Skull: Persistence of diastases of the left lambdoid suture with adjacent focal soft tissue swelling over the posterior parietal scalp and minimal air within the adjacent soft tissues and inner table of the skull. Sinuses/Orbits: Persistent opacification over the left mastoid air cells likely hemorrhagic debris. Orbits and remaining sinuses are normal. Other: None. IMPRESSION: Stable acute subarachnoid hemorrhage over the right frontal region with stable tiny right frontal acute subdural hematoma. Possible  tiny amount of stable acute subarachnoid hemorrhage over the left posterior temporal region. Small amount of acute intraventricular hemorrhage over the occipital horns which is new. Stable diastases of the left lambdoid suture with associated soft tissue swelling of the left posterior parietal scalp. Hemorrhagic debris over the adjacent left mastoid sinus unchanged. Electronically Signed   By: Elberta Fortis M.D.   On: 05/31/2017 02:25   Ct Head Wo Contrast  Result Date: 05/30/2017 CLINICAL DATA:  Patient found after falling on street.  Dizzy. EXAM: CT HEAD WITHOUT CONTRAST TECHNIQUE: Contiguous axial images were obtained from the base of the skull through the vertex without intravenous contrast. COMPARISON:  CT head 11/08/2010. FINDINGS: Brain: There is evidence for posttraumatic RIGHT frontal subarachnoid hemorrhage over the RIGHT anterior and inferior frontal lobe. Parenchymal contusion difficult to see but not excluded. There is a small acute subdural hematoma anteriorly, up to 4 mm thick. There is a slight interhemispheric component. The hemorrhage represents a contrecoup injury from a fall onto the LEFT occiput. There is diastasis of the LEFT lambdoid suture. There is no occipital bone fracture. There is a LEFT temporal occipital scalp hematoma with laceration. There are a few tiny bubbles of air in the epidural space as seen on image 12 series 2. There is some fluid/blood in the LEFT mastoid air cells. Vascular: No hyperdense vessel or unexpected calcification. Skull: LEFT mastoid suture diastasis.  No frank skull fracture. Sinuses/Orbits: No layering sinus fluid.  No visible orbital injury. Other: No evidence for hemotympanum.  LEFT mastoid fluid/ blood. IMPRESSION: Posttraumatic RIGHT frontal subarachnoid hemorrhage, and small RIGHT frontal acute subdural hematoma, up to 4 mm thick representing a contrecoup injury. Fall onto the LEFT occiput resulting in diastasis of the LEFT lambdoid suture, minimal  pneumocephalus, LEFT-sided scalp hematoma, and subcutaneous air due to laceration and/or disruption of the mastoid air cells. Critical Value/emergent results were called by telephone at the time of interpretation on 05/30/2017 at 1:25 pm to Dr. Doug Sou , who verbally acknowledged these results. Electronically Signed   By: Elsie Stain M.D.   On: 05/30/2017 13:33   Ct Cervical Spine Wo Contrast  Result Date: 05/30/2017 CLINICAL DATA:  Larey Seat.  Possible loss of consciousness. EXAM: CT CERVICAL SPINE WITHOUT CONTRAST TECHNIQUE: Multidetector CT imaging of the cervical spine was performed without intravenous contrast. Multiplanar CT image reconstructions were also generated. COMPARISON:  CT head reported separately. FINDINGS: Alignment: Straightening of the normal cervical lordosis. No traumatic subluxation. Skull base and vertebrae: No fracture or worrisome osseous lesion. Soft tissues and spinal canal: No prevertebral fluid or swelling. No visible canal hematoma. Disc levels: Severe disc space narrowing from C2-3 through C7-T1,  with varying degrees of osseous spurring. Upper chest: Severe emphysematous change. No pneumothorax or visible upper rib fracture. Other: Nuchal ligament calcification.  Carotid atherosclerosis. IMPRESSION: Advanced cervical spondylosis. No cervical spine fracture or traumatic subluxation. Electronically Signed   By: Elsie Stain M.D.   On: 05/30/2017 15:02   Dg Lumbar Spine 1 View  Result Date: 05/30/2017 CLINICAL DATA:  Low back pain EXAM: LUMBAR SPINE - 1 VIEW COMPARISON:  Lumbar spine radiograph 07/04/2016 FINDINGS: No acute fracture or listhesis. There is severe disc space loss at all levels, greatest at L4-5 and L5-S1. This is unchanged compared to 07/04/2016. Multilevel severe facet arthrosis is also unchanged. IMPRESSION: Severe degenerative disc and facet disease, unchanged. No compression fracture or static subluxation in the AP direction. Electronically Signed   By:  Deatra Robinson M.D.   On: 05/30/2017 18:07   Dg Pelvis Portable  Result Date: 05/30/2017 CLINICAL DATA:  Low back pain.  Fall. EXAM: PORTABLE PELVIS 1-2 VIEWS COMPARISON:  None. FINDINGS: There is complete loss of the joint space of the left hip with associated subchondral mixed sclerotic and lucent change. There is moderate right hip joint space narrowing. No acute fracture or dislocation. IMPRESSION: Mild-to-moderate right and severe left hip osteoarthrosis. Electronically Signed   By: Deatra Robinson M.D.   On: 05/30/2017 18:16   Dg Chest Port 1 View  Result Date: 06/01/2017 CLINICAL DATA:  Fever EXAM: PORTABLE CHEST 1 VIEW COMPARISON:  09/25/2016 FINDINGS: Mild patchy left lower lobe opacity, atelectasis versus pneumonia. Mild patchy right basilar opacity, likely atelectasis. No pleural effusion or pneumothorax. Mild cardiomegaly. IMPRESSION: Mild patchy left lower lobe opacity, atelectasis versus pneumonia. Electronically Signed   By: Charline Bills M.D.   On: 06/01/2017 07:33   Dg Shoulder Left  Result Date: 05/30/2017 CLINICAL DATA:  Left shoulder pain after fall. EXAM: LEFT SHOULDER - 2+ VIEW COMPARISON:  Single-view left shoulder from same day. FINDINGS: No acute fracture or malalignment. Mild acromioclavicular degenerative changes. No focal bony abnormality. IMPRESSION: Mild acromioclavicular degenerative changes. No acute osseous abnormality. Electronically Signed   By: Obie Dredge M.D.   On: 05/30/2017 17:17     Subjective: - no chest pain, shortness of breath, no abdominal pain, nausea or vomiting.   Discharge Exam: Vitals:   06/06/17 0425 06/06/17 0745  BP: (!) 156/62 (!) 152/63  Pulse: 61 62  Resp: 18   Temp: 97.8 F (36.6 C)   SpO2: 94%     General: Pt is alert, awake, not in acute distress Cardiovascular:irregular, no rubs, no gallops Respiratory: CTA bilaterally, no wheezing, no rhonchi Abdominal: Soft, NT, ND, bowel sounds + Extremities: no edema, no  cyanosis    The results of significant diagnostics from this hospitalization (including imaging, microbiology, ancillary and laboratory) are listed below for reference.     Microbiology: Recent Results (from the past 240 hour(s))  MRSA PCR Screening     Status: None   Collection Time: 05/30/17  9:08 PM  Result Value Ref Range Status   MRSA by PCR NEGATIVE NEGATIVE Final    Comment:        The GeneXpert MRSA Assay (FDA approved for NASAL specimens only), is one component of a comprehensive MRSA colonization surveillance program. It is not intended to diagnose MRSA infection nor to guide or monitor treatment for MRSA infections.      Labs: BNP (last 3 results) No results for input(s): BNP in the last 8760 hours. Basic Metabolic Panel:  Recent Labs Lab 06/02/17 0546 06/03/17  0403 06/04/17 1508 06/05/17 0444 06/06/17 0515  NA 132* 136 139 141 146*  K 3.1* 3.3* 3.7 3.1* 3.1*  CL 96* 100* 102 104 107  CO2 27 26 24 28 29   GLUCOSE 135* 159* 158* 123* 115*  BUN 18 21* 53* 48* 42*  CREATININE 1.84* 1.65* 2.96* 2.03* 1.42*  CALCIUM 8.7* 9.1 9.3 8.7* 8.7*   Liver Function Tests:  Recent Labs Lab 05/30/17 1340  AST 29  ALT 22  ALKPHOS 77  BILITOT 0.7  PROT 7.5  ALBUMIN 4.4   No results for input(s): LIPASE, AMYLASE in the last 168 hours. No results for input(s): AMMONIA in the last 168 hours. CBC:  Recent Labs Lab 05/30/17 1340  06/02/17 0546 06/03/17 0403 06/04/17 0400 06/05/17 0444 06/06/17 0515  WBC 6.1  < > 11.0* 15.4* 15.3* 16.8* 14.1*  NEUTROABS 5.3  --   --   --   --   --   --   HGB 13.9  < > 12.7* 13.3 13.1 12.3* 12.0*  HCT 42.1  < > 37.5* 39.3 38.2* 36.4* 35.0*  MCV 94.6  < > 91.7 91.6 91.6 92.9 94.3  PLT 171  < > 141* 146* 128* 120* 115*  < > = values in this interval not displayed. Cardiac Enzymes:  Recent Labs Lab 05/30/17 1340 05/30/17 2154 05/31/17 0219 05/31/17 0731  TROPONINI 0.17* 0.25* 0.35* 0.42*   BNP: Invalid input(s):  POCBNP CBG: No results for input(s): GLUCAP in the last 168 hours. D-Dimer No results for input(s): DDIMER in the last 72 hours. Hgb A1c No results for input(s): HGBA1C in the last 72 hours. Lipid Profile No results for input(s): CHOL, HDL, LDLCALC, TRIG, CHOLHDL, LDLDIRECT in the last 72 hours. Thyroid function studies No results for input(s): TSH, T4TOTAL, T3FREE, THYROIDAB in the last 72 hours.  Invalid input(s): FREET3 Anemia work up No results for input(s): VITAMINB12, FOLATE, FERRITIN, TIBC, IRON, RETICCTPCT in the last 72 hours. Urinalysis    Component Value Date/Time   COLORURINE YELLOW 06/04/2017 1930   APPEARANCEUR CLOUDY (A) 06/04/2017 1930   LABSPEC 1.023 06/04/2017 1930   PHURINE 6.0 06/04/2017 1930   GLUCOSEU NEGATIVE 06/04/2017 1930   HGBUR LARGE (A) 06/04/2017 1930   HGBUR small 10/22/2008 0859   BILIRUBINUR NEGATIVE 06/04/2017 1930   KETONESUR NEGATIVE 06/04/2017 1930   PROTEINUR 30 (A) 06/04/2017 1930   UROBILINOGEN 0.2 10/22/2008 0859   NITRITE NEGATIVE 06/04/2017 1930   LEUKOCYTESUR TRACE (A) 06/04/2017 1930   Sepsis Labs Invalid input(s): PROCALCITONIN,  WBC,  LACTICIDVEN   Time coordinating discharge: 35 minutes  SIGNED:  Pamella Pert, MD  [facility] 06/06/2017, 10:54 AM Pager [phone]  If 7PM-7AM, please contact night-coverage [email or web address] Password [email or web address]

## 2017-06-06 NOTE — Progress Notes (Signed)
Patient discharge to SNF with foley vital signs stable 156/67, Pulse 71, patien stable and transport by [facility]

## 2017-06-06 NOTE — Progress Notes (Signed)
Pharmacy Antibiotic Note  [name] is a 83 y.o. male  with aspiration pneumonia.  Pharmacy has been consulted for Unasyn dosing - day #6.  -SCr trending down to 1.42, CrCl~36 -WBC down to 14.1, tmax/24h 100.3, no cultures  Plan: Increase Unasyn to 1.5gm IV q8h due to improved renal function Monitor clinical progress, c/s, renal function F/u de-escalation plan/LOT - consider 7 days?   Height:  (180.3 cm) Weight: 176 lb (79.8 kg) IBW/kg (Calculated) : 75.3  Temp (24hrs), Avg:99.1 F (37.3 C), Min:97.8 F (36.6 C), Max:99.9 F (37.7 C)   Recent Labs Lab 06/02/17 0546 06/03/17 0403 06/04/17 0400 06/04/17 1508 06/05/17 0444 06/06/17 0515  WBC 11.0* 15.4* 15.3*  --  16.8* 14.1*  CREATININE 1.84* 1.65*  --  2.96* 2.03* 1.42*    Estimated Creatinine Clearance: 36.1 mL/min (A) (by C-G formula based on SCr of 1.42 mg/dL (H)).    No Known Allergies  Antimicrobials this admission: 10/4 Unasyn >>   Microbiology results: 10/2 MRSA PCR: negative  Babs Bertin, PharmD, BCPS Clinical Pharmacist Rx Phone # for today: [phone] After 3:30PM, please call Main Rx: [phone] 06/06/2017 10:46 AM

## 2017-06-06 NOTE — Clinical Social Work Placement (Signed)
   CLINICAL SOCIAL WORK PLACEMENT  NOTE  Date:  06/06/2017  Patient Details  Name: [name] MRN: [id number] Date of Birth: 30-Oct-1923  Clinical Social Work is seeking post-discharge placement for this patient at the Skilled  Nursing Facility level of care (*CSW will initial, date and re-position this form in  chart as items are completed):  Yes   Patient/family provided with [place] Clinical Social Work Department's list of facilities offering this level of care within the geographic area requested by the patient (or if unable, by the patient's family).  Yes   Patient/family informed of their freedom to choose among providers that offer the needed level of care, that participate in Medicare, Medicaid or managed care program needed by the patient, have an available bed and are willing to accept the patient.  Yes   Patient/family informed of [facility]'s ownership interest in [facility] and [facility], as well as of the fact that they are under no obligation to receive care at these facilities.  PASRR submitted to EDS on 06/01/17     PASRR number received on 06/01/17     Existing PASRR number confirmed on       FL2 transmitted to all facilities in geographic area requested by pt/family on 06/01/17     FL2 transmitted to all facilities within larger geographic area on       Patient informed that his/her managed care company has contracts with or will negotiate with certain facilities, including the following:        Yes   Patient/family informed of bed offers received.  Patient chooses bed at [facility]     Physician recommends and patient chooses bed at      Patient to be transferred to [facility] on 06/06/17.  Patient to be transferred to facility by [facility]     Patient family notified on 06/06/17 of transfer.  Name of family member notified:  [name]     PHYSICIAN       Additional Comment:     _______________________________________________ Mearl Latin, LCSWA 06/06/2017, 11:11 AM

## 2017-06-06 NOTE — Progress Notes (Addendum)
12pm- CSW received approval for patient to discharge today to [facility].   11:15-CSW awaiting insurance approval and then can send the patient to [facility] by PTAR.   Osborne Casco Zayaan Kozak LCSWA [phone]

## 2017-06-06 NOTE — Progress Notes (Signed)
  Speech Language Pathology Treatment: Dysphagia  Patient Details Name: [name] MRN: [id number] DOB: Aug 21, 1924 Today's Date: 06/06/2017 Time: 5284-1324 SLP Time Calculation (min) (ACUTE ONLY): 42 min  Assessment / Plan / Recommendation Clinical Impression  Intake has been poor per granddaughter Carollee Herter.  Prolonged oral holding noted - suspect combination of neuro impairment from frontal cva and dementia based.  Oral suction required to clear eggs/grits, etc from lateral sulci prior to SLP assisting with po trials.  Pt did have cough x2 with thin thin liquids - that may indicate airway compromise thus recommend to follow closely at SNF for adequate nutrition and diminishing aspiration risk.  Extensive education with demonstration took place during session.  Concern for adequacy of hydration and nutrition present.     Informed granddaughter and pt to strategies to mitigate dysphagia including encouraging self feeding, using dry spoon pressure to help elicit swallow, oral suctioning if pt not swallowing and after meals, verbally cueing pt to swallow and observing laryngeal elevation to assure swallow triggered.  Also advised to gustatory input- eg: carbonation, cold, sweet may help trigger faster oral transiting/swallowing.    Minimal whitish coating in buccal region bilateral, advised Carollee Herter to monitor and make MD aware at SNF  -as may be early beginnings of oral candidiasis.  Intake has been poor and granddaughter states pt will not consume puree.  Advised to lack of oral manipulation with po - pt mostly allowing spillage of boluses into pharynx poorly controlled with resultant pharyngeal swallow reflexive response. Granddaughter reported understanding to information and expressed gratitude.  Maximize liquid nutrition recommended. Hopeful for ongoing improvement of intake/swallow with medical stability.         HPI HPI: Pt is a 83 y.o.malewith medical history significant ofhypertension and  dementia who was brought to the ER by the family for evaluation s/p fall. CT Head showed posttraumatic right frontal subarachnoid hemorrhage, small right frontal acute subdural hematoma representing contrecoup injury, left occipital diastasis, scalp hematoma and slight subcutaneous air due to laceration. CT of the cervical spine showed advanced cervical spondylosis. Pt was transferred to [facility] for closer monitoring but with no surgical intervention indicated at this time.       SLP Plan  Continue with current plan of care       Recommendations  Diet recommendations: Dysphagia 1 (puree);Thin liquid (maximize liquid nutrition) Liquids provided via: Cup;Straw Medication Administration: Crushed with puree Supervision: Staff to assist with self feeding;Full supervision/cueing for compensatory strategies Compensations: Slow rate;Small sips/bites;Follow solids with liquid;Lingual sweep for clearance of pocketing (oral suction if pt prolonged holding) Postural Changes and/or Swallow Maneuvers: Seated upright 90 degrees;Upright 30-60 min after meal                Oral Care Recommendations: Oral care before and after PO Follow up Recommendations: 24 hour supervision/assistance;Skilled Nursing facility SLP Visit Diagnosis: Dysphagia, unspecified (R13.10);Cognitive communication deficit (M01.027) Plan: Continue with current plan of care       GO               Donavan Burnet, MS [facility] SLP [phone]  Chales Abrahams 06/06/2017, 11:25 AM

## 2017-06-06 NOTE — Progress Notes (Signed)
Patient will DC to: [facility]) Anticipated DC date: 06/06/17 Family notified: Daughter, [name] Transport by: Sharin Mons 1:30pm   Per MD patient ready for DC to [facility] SNF. RN, patient, patient's family, and facility notified of DC. Discharge Summary sent to facility. RN given number for report [phone]). DC packet on chart. Ambulance transport requested for patient.   CSW signing off.  Cristobal Goldmann, [place] Clinical Social Worker [phone]

## 2017-06-06 NOTE — Progress Notes (Signed)
Occupational Therapy Treatment Patient Details Name: [name] MRN: [id number] DOB: [birth year] Today's Date: 06/06/2017    History of present illness 83 y.o.malewith medical history significant ofhypertension, dementia admitted after a ground-level fall and found to have right frontal subarachnoid hemorrhage as well as small right frontal acute subdural hematoma.   OT comments  Pt with decline in function with adls since last session. Pt with difficulty coming sit to stand today.  Pt answering "uh huh" to all questions but not answering accurately.  Felt pt would be participating well and then "zone out" during session and not follow commands.  Daughter was present and said she has seen this behavior from him in the hospital. Spoke with daughter about what to expect at the SNF    Follow Up Recommendations  SNF;Supervision/Assistance - 24 hour    Equipment Recommendations  Tub/shower bench    Recommendations for Other Services      Precautions / Restrictions Precautions Precautions: Fall Restrictions Weight Bearing Restrictions: No       Mobility Bed Mobility Overal bed mobility: Needs Assistance Bed Mobility: Supine to Sit     Supine to sit: Max assist;HOB elevated     General bed mobility comments: Pt not following commands to sequence getting out of bed.  Once the movement pattern began, pt did help but less than 10%.  Transfers Overall transfer level: Needs assistance Equipment used: 1 person hand held assist Transfers: Lateral/Scoot Transfers Sit to Stand: +2 physical assistance;Total assist        Lateral/Scoot Transfers: Max assist General transfer comment: attempted to stand however pt not assisting; pt was able to assist minimally with lateral scoot to recliner with use of bed pad; cues for sequencing and hand placement     Balance Overall balance assessment: Needs assistance Sitting-balance support: Bilateral upper extremity supported;Feet  supported Sitting balance-Leahy Scale: Poor Sitting balance - Comments: pt required mod/max A for sitting balance when attempting to sit upright and in midline; pt able to maitnain balance when leaned forward on forearms with min guard/min A  Postural control: Posterior lean     Standing balance comment: Never got pt into full standing today.  Pt unable to power up to full standing.                           ADL either performed or assessed with clinical judgement   ADL Overall ADL's : Needs assistance/impaired     Grooming: Wash/dry hands;Wash/dry face;Minimal assistance;Sitting Grooming Details (indicate cue type and reason): Pt required assist for follow through and initiation             Lower Body Dressing: Total assistance;Sit to/from stand Lower Body Dressing Details (indicate cue type and reason): Pt with great difficulty coming sit to stand today.  Pt unable to do so without +2 total assist. Pt can move LEs but not using them to assist with mobility. Toilet Transfer: Total assistance;Squat-pivot;BSC Toilet Transfer Details (indicate cue type and reason): simulated through recliner transfer Toileting- Clothing Manipulation and Hygiene: Total assistance       Functional mobility during ADLs: Maximal assistance General ADL Comments: Pt sat at EOB for appx 3 minutes with min to total assist at times with a posterior lean. Pt at times seemed to "zone out" during session, not following commands and then would come back to and follow commands.       Vision       Perception  Praxis      Cognition Arousal/Alertness: Lethargic Behavior During Therapy: Flat affect Overall Cognitive Status: Impaired/Different from baseline Area of Impairment: Attention;Memory;Following commands;Safety/judgement;Awareness;Problem solving                   Current Attention Level: Sustained Memory: Decreased short-term memory Following Commands: Follows one step  commands inconsistently;Follows one step commands with increased time Safety/Judgement: Decreased awareness of safety;Decreased awareness of deficits Awareness: Intellectual Problem Solving: Slow processing;Decreased initiation;Difficulty sequencing;Requires verbal cues General Comments: daughter present.  Pt answering some questions but answering the same each time so not sure of accuracy        Exercises     Shoulder Instructions       General Comments Pt appears weaker today than past OT sessions unable to make it into standing and also following fewer commands.      Pertinent Vitals/ Pain       Pain Assessment: Faces Faces Pain Scale: Hurts a little bit Pain Location: uncertain.  Said "ouch" when attempting to stand Pain Descriptors / Indicators: Grimacing;Moaning Pain Intervention(s): Limited activity within patient's tolerance;Monitored during session;Repositioned  Home Living                                          Prior Functioning/Environment              Frequency  Min 2X/week        Progress Toward Goals  OT Goals(current goals can now be found in the care plan section)  Progress towards OT goals: Not progressing toward goals - comment (Pt lethargic today and not as participatory)  Acute Rehab OT Goals Patient Stated Goal: none stated OT Goal Formulation: With patient Time For Goal Achievement: 06/14/17 Potential to Achieve Goals: Good ADL Goals Pt Will Perform Grooming: with supervision;standing Pt Will Perform Upper Body Bathing: with set-up;sitting Pt Will Perform Lower Body Bathing: with supervision;sitting/lateral leans Pt Will Transfer to Toilet: with supervision;ambulating Pt Will Perform Toileting - Clothing Manipulation and hygiene: with supervision;sit to/from stand Pt Will Perform Tub/Shower Transfer: Tub transfer;with min guard assist;with caregiver independent in assisting;tub bench;3 in 1 Additional ADL Goal #1: Pt  will demonstrate bed mobility at supervision level as precursor for participation in ADL  Plan Discharge plan remains appropriate    Co-evaluation                 AM-PAC PT "6 Clicks" Daily Activity     Outcome Measure   Help from another person eating meals?: A Lot Help from another person taking care of personal grooming?: A Lot Help from another person toileting, which includes using toliet, bedpan, or urinal?: A Lot Help from another person bathing (including washing, rinsing, drying)?: A Lot Help from another person to put on and taking off regular upper body clothing?: A Lot Help from another person to put on and taking off regular lower body clothing?: A Lot 6 Click Score: 12    End of Session Equipment Utilized During Treatment: Rolling walker  OT Visit Diagnosis: Unsteadiness on feet (R26.81);History of falling (Z91.81);Other symptoms and signs involving cognitive function   Activity Tolerance Patient limited by lethargy   Patient Left in chair;with call bell/phone within reach;with chair alarm set;with family/visitor present   Nurse Communication Other (comment) (more assist needed)        Time: 1610-9604 OT Time Calculation (min): 27 min  Charges: OT General Charges $OT Visit: 1 Visit OT Treatments $Self Care/Home Management : 8-22 mins $Therapeutic Activity: 8-22 mins  Tory Emerald, OTR/L   Hope Budds 06/06/2017, 1:31 PM

## 2017-12-11 ENCOUNTER — Other Ambulatory Visit: Payer: Self-pay | Admitting: Neurosurgery

## 2017-12-11 DIAGNOSIS — I639 Cerebral infarction, unspecified: Secondary | ICD-10-CM

## 2017-12-25 IMAGING — CR DG CHEST 2V
2 series · 2 of 2 positions shown · non-contrast
Comparison: 09/02/2010 chest radiograph

CLINICAL DATA: Syncope.

EXAM:
CHEST  2 VIEW

[chest lat]
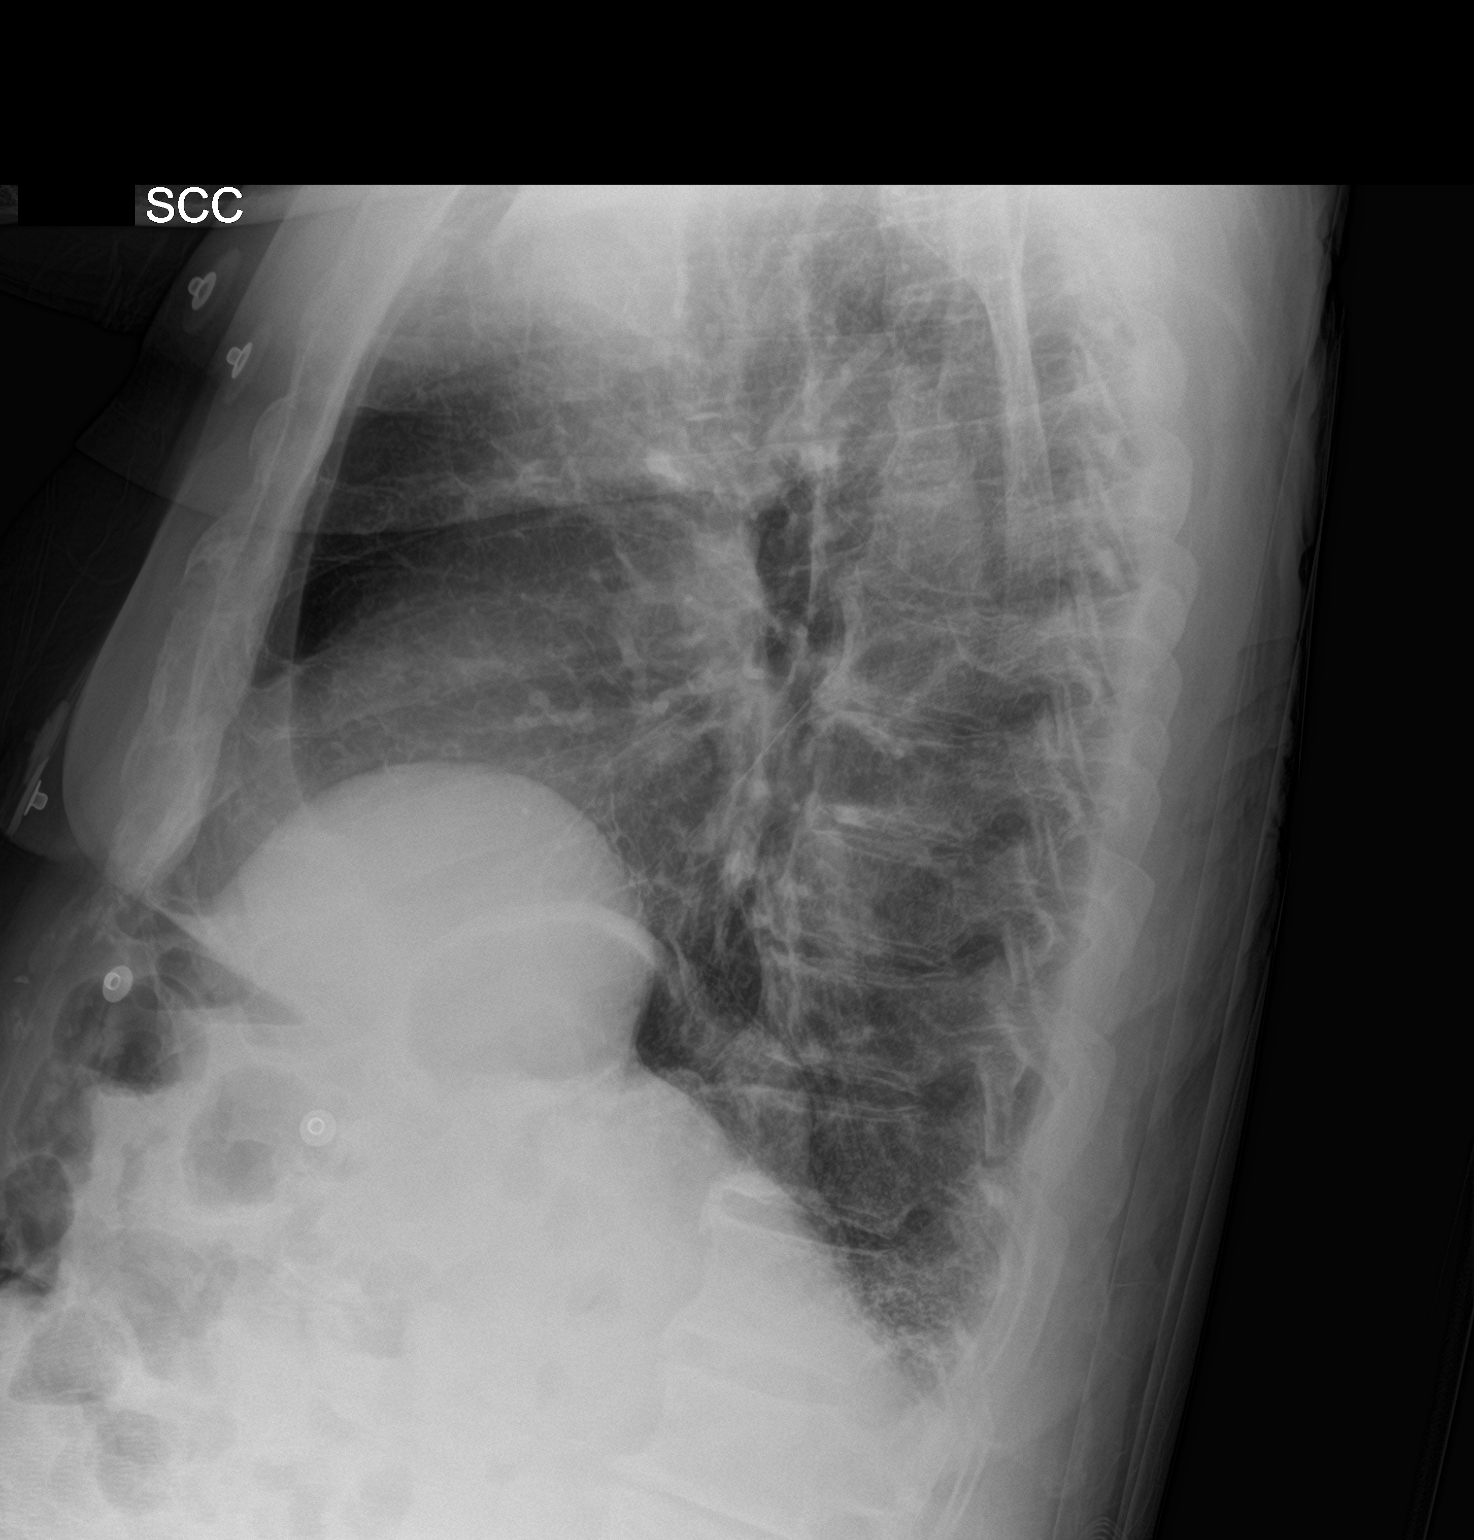

[chest ap]
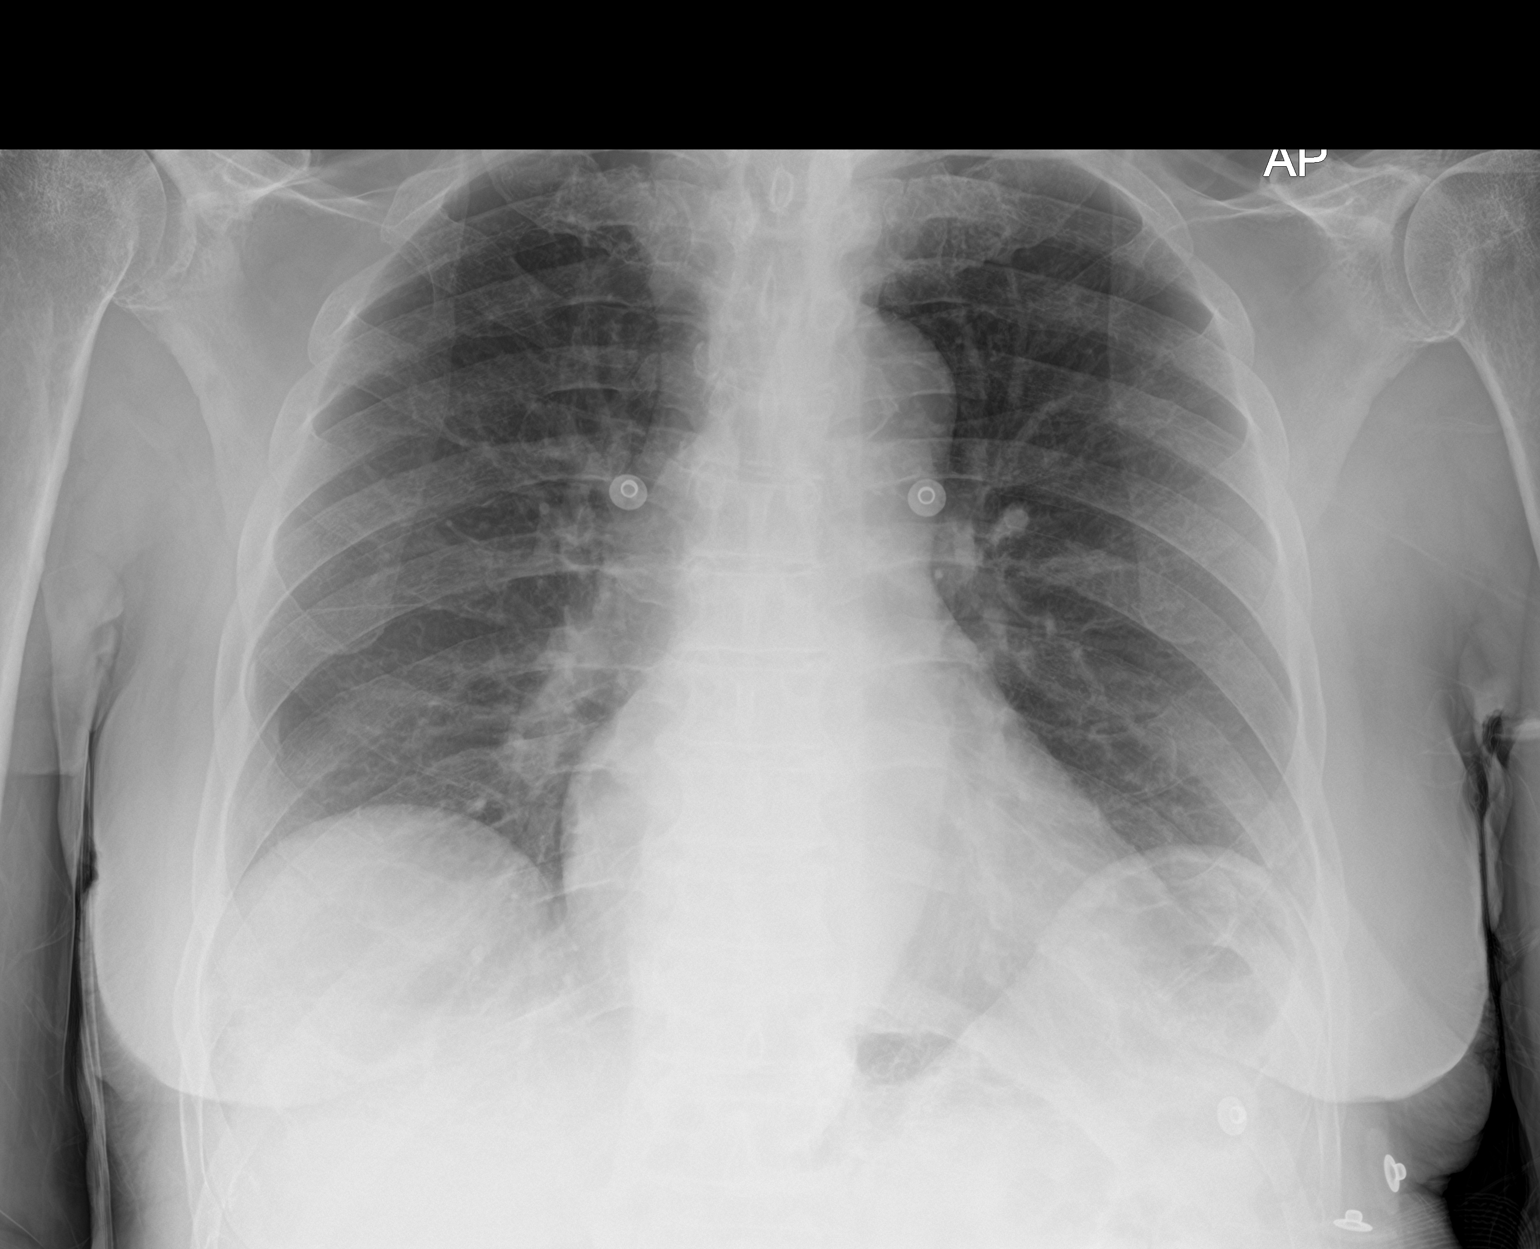

[2 of 2 positions shown; findings below may reference images not displayed]

FINDINGS: Cardiomegaly identified.

Mild peribronchial thickening again noted.

There is no evidence of focal airspace disease, pulmonary edema,
suspicious pulmonary nodule/mass, pleural effusion, or pneumothorax.
No acute bony abnormalities are identified.
IMPRESSION: Cardiomegaly without evidence of acute cardiopulmonary disease.

## 2018-01-27 DEATH — deceased

## 2018-09-03 IMAGING — CT CT HEAD W/O CM
3 of 5 series · 14 of 47 positions shown, 16 images · non-contrast
Comparison: 05/31/2017

CLINICAL DATA: Followup for intracranial hemorrhage.

EXAM:
CT HEAD WITHOUT CONTRAST
TECHNIQUE: Contiguous axial images were obtained from the base of the skull
through the vertex without intravenous contrast.

[Series 4: head without · axial · non-contrast · 0.45mm/px · z∈[-58,+2]mm · 3 of 32 slices shown]
[im 7/32  brain]
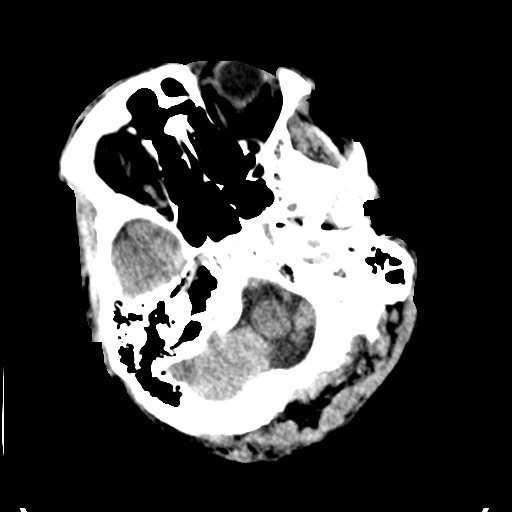
[im 13/32  brain]
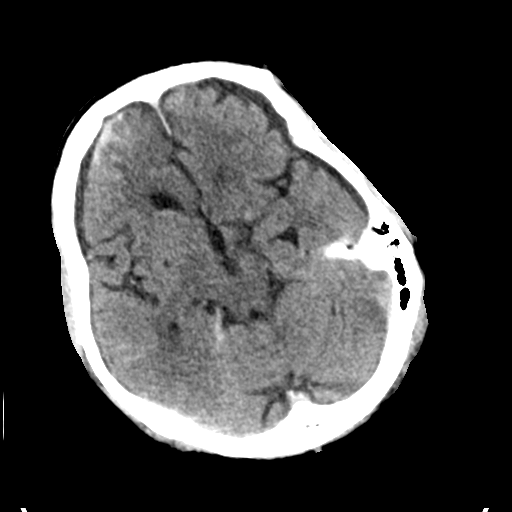
[im 19/32  brain]
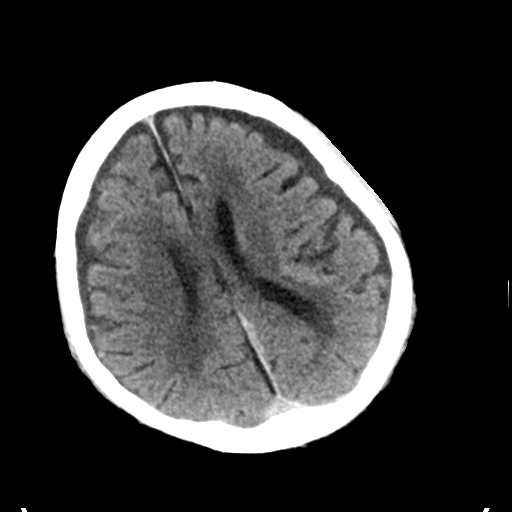

[Series 5: head without cor · coronal · non-contrast · 0.31mm/px · 3 of 67 slices shown]
[im 23/67  brain]
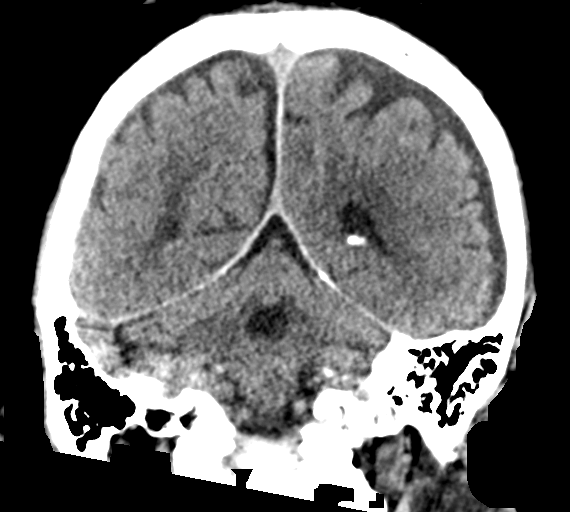
[im 30/67  brain]
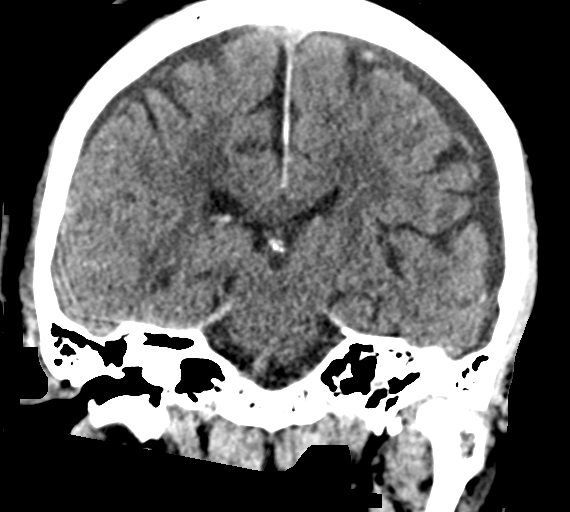
[im 37/67  brain]
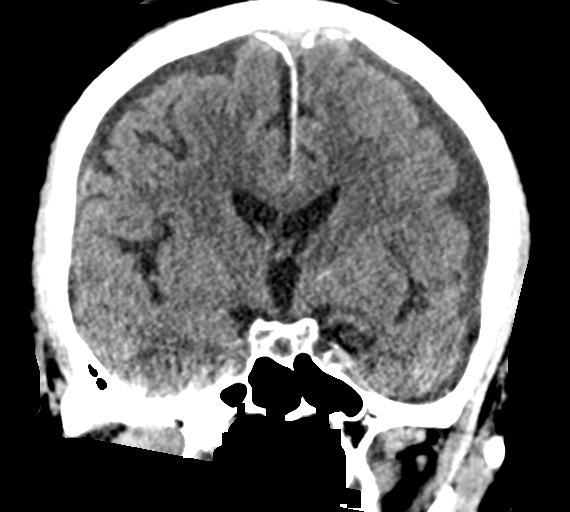

[Series 7: head without ax · axial · non-contrast · 0.31mm/px · z∈[-102,+17]mm · 8 of 53 slices shown, 10 images]
[im 6/53  brain]
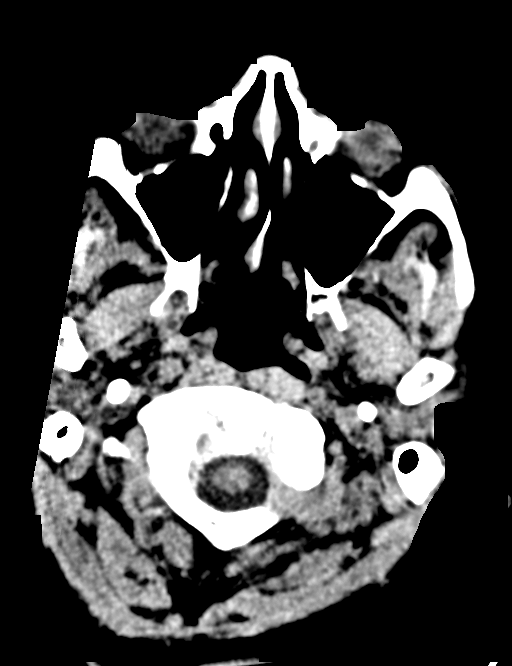
[im 6/53  bone]
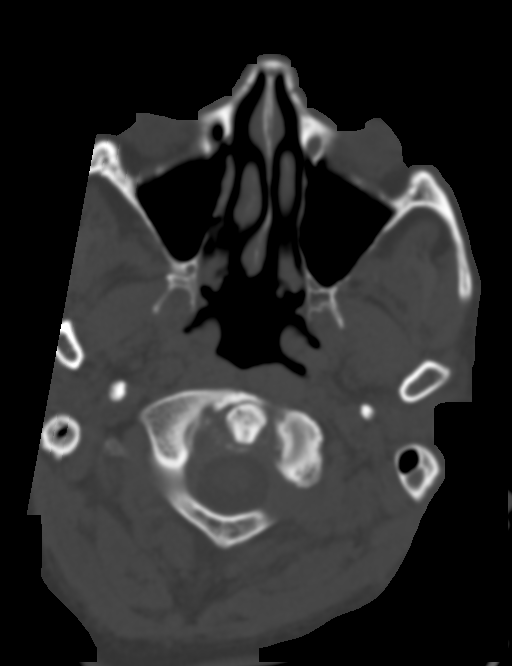
[im 12/53  brain]
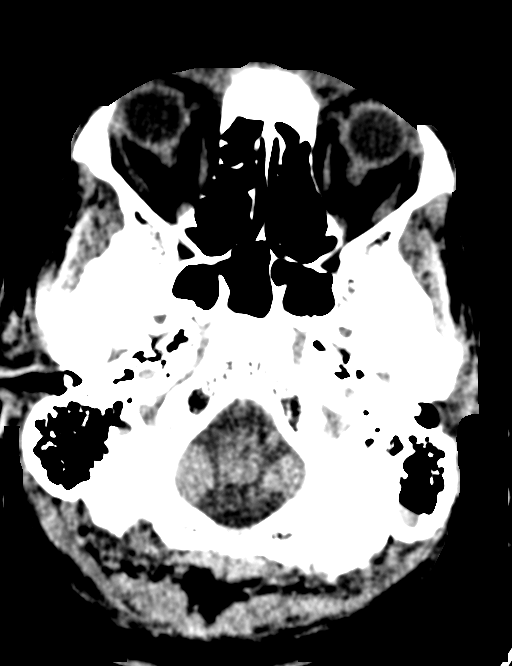
[im 18/53  brain]
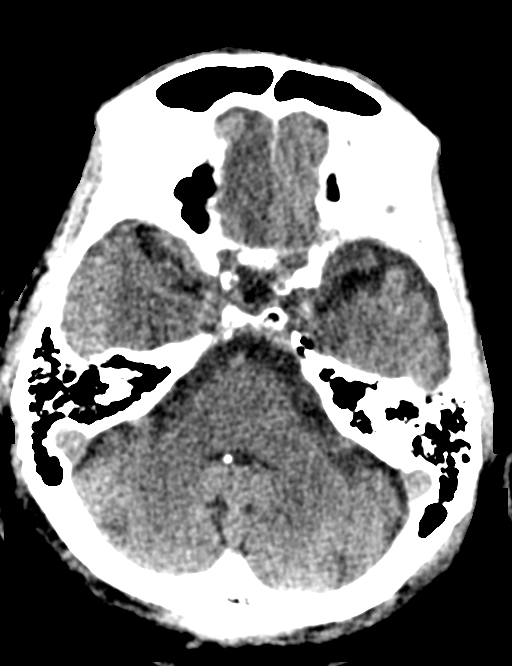
[im 24/53  brain]
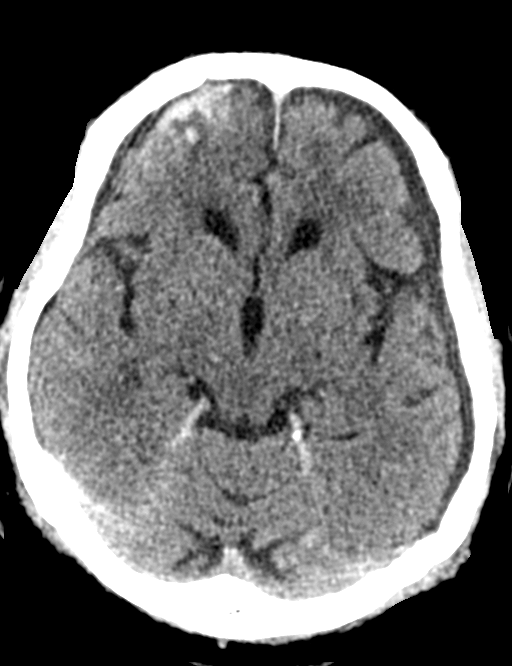
[im 29/53  brain]
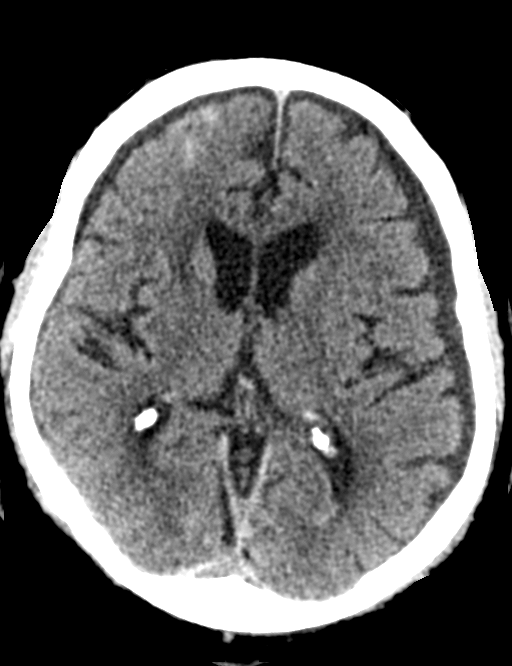
[im 29/53  bone]
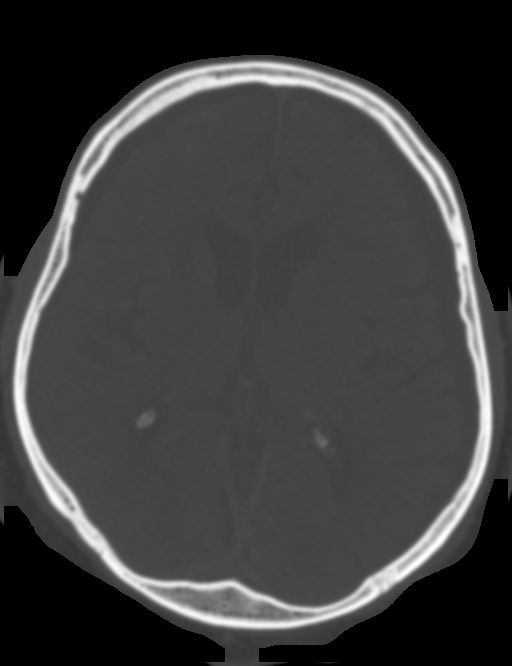
[im 35/53  brain]
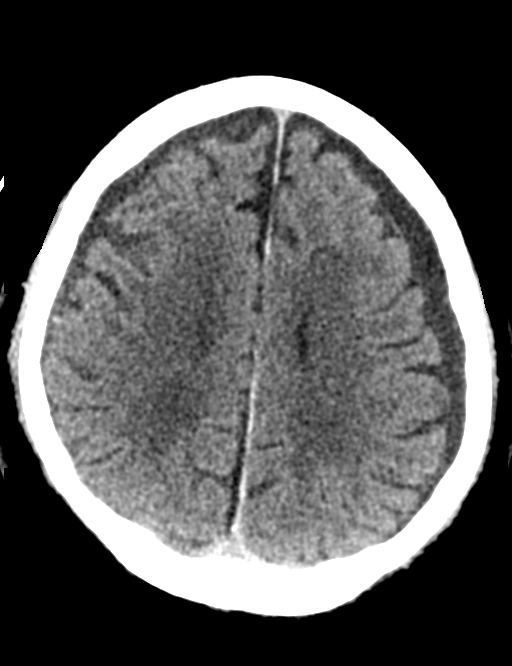
[im 41/53  brain]
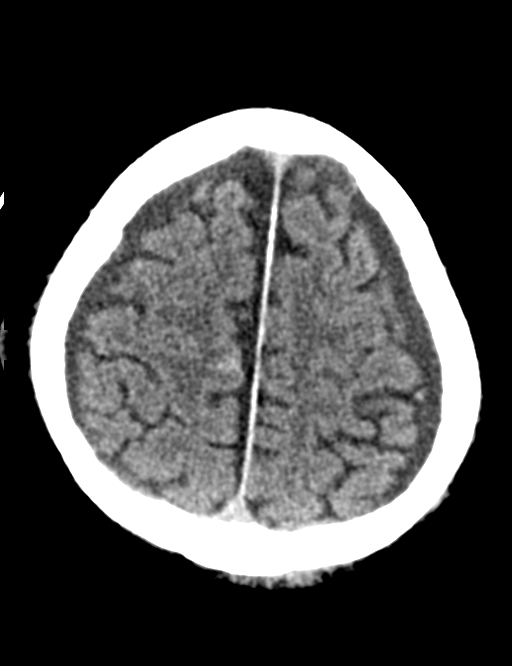
[im 47/53  brain]
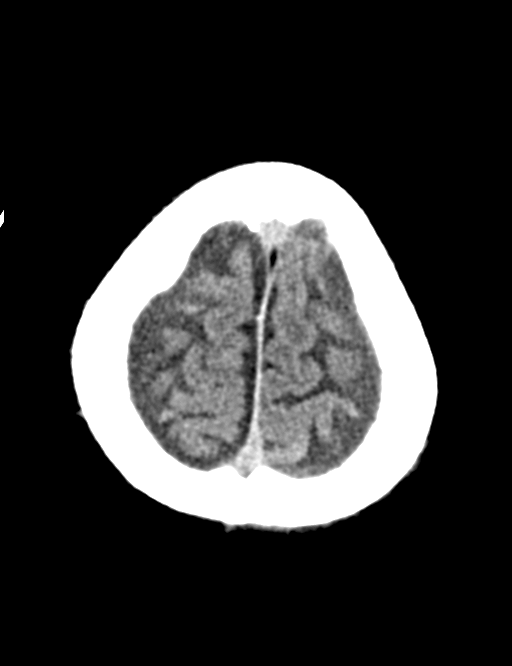

[14 of 47 positions shown; findings below may reference images not displayed]

FINDINGS: Brain: Right frontal subarachnoid hemorrhage has decreased in
thickness. Mild associated right anterior frontal lobe parenchymal
hemorrhage has also slightly decreased from the prior exam.

There is a widening of the extra-axial spaces over both hemispheres,
left greater than right, left measuring 9 mm in thickness and right
6 mm. This fluid is of relative increased attenuation compared to
the ventricular fluid likely due to contents containing products of
hemorrhage. This creates mild mass effect flattening the underlying
gyri and mildly decreasing size of the lateral ventricles.

There are no parenchymal masses. There is no evidence of an ischemic
infarct.

Hemorrhage noted in the dependent lateral ventricles with prior exam
is not appreciated currently.

There is no new intracranial hemorrhage.

Vascular: No hyperdense vessel or unexpected calcification.

Skull: No skull fracture or skull lesion.
IMPRESSION: 1. Previously noted hemorrhage has improved, decreased in size and
conspicuity. There is no new acute intracranial hemorrhage.
2. There is a widening of the extra-axial spaces over both
hemispheres. This leads to mild mass effect.
3. No evidence of an ischemic infarct.
